# Patient Record
Sex: Male | Born: 1940
Health system: Southern US, Community
[De-identification: ages and names within clinical notes are randomized; demographics above are authoritative.]

## PROBLEM LIST (undated history)

## (undated) DIAGNOSIS — K219 Gastro-esophageal reflux disease without esophagitis: Secondary | ICD-10-CM

## (undated) DIAGNOSIS — I499 Cardiac arrhythmia, unspecified: Secondary | ICD-10-CM

## (undated) DIAGNOSIS — Z86718 Personal history of other venous thrombosis and embolism: Secondary | ICD-10-CM

## (undated) DIAGNOSIS — F1010 Alcohol abuse, uncomplicated: Secondary | ICD-10-CM

## (undated) DIAGNOSIS — J449 Chronic obstructive pulmonary disease, unspecified: Secondary | ICD-10-CM

## (undated) DIAGNOSIS — Z8619 Personal history of other infectious and parasitic diseases: Secondary | ICD-10-CM

## (undated) DIAGNOSIS — I429 Cardiomyopathy, unspecified: Secondary | ICD-10-CM

## (undated) DIAGNOSIS — D649 Anemia, unspecified: Secondary | ICD-10-CM

## (undated) DIAGNOSIS — I509 Heart failure, unspecified: Secondary | ICD-10-CM

## (undated) HISTORY — PX: CARDIAC CATHETERIZATION: SHX172

## (undated) HISTORY — PX: COLONOSCOPY W/ POLYPECTOMY: SHX1380

---

## 2003-03-27 ENCOUNTER — Other Ambulatory Visit: Payer: Self-pay

## 2007-02-10 ENCOUNTER — Ambulatory Visit: Payer: Self-pay | Admitting: Unknown Physician Specialty

## 2007-06-11 ENCOUNTER — Ambulatory Visit: Payer: Self-pay | Admitting: Unknown Physician Specialty

## 2012-01-26 ENCOUNTER — Inpatient Hospital Stay: Payer: Self-pay | Admitting: Internal Medicine

## 2012-01-26 LAB — CBC
HCT: 42.5 %
HGB: 14.9 g/dL
MCH: 33.5 pg
MCHC: 35.1 g/dL
MCV: 96 fL
Platelet: 210 x10 3/mm 3
RBC: 4.45 x10 6/mm 3
RDW: 13.8 %
WBC: 8.5 x10 3/mm 3

## 2012-01-26 LAB — PROTIME-INR
INR: 1.3
Prothrombin Time: 16.6 s — ABNORMAL HIGH

## 2012-01-26 LAB — COMPREHENSIVE METABOLIC PANEL
Albumin: 3.7 g/dL (ref 3.4–5.0)
BUN: 12 mg/dL (ref 7–18)
Calcium, Total: 8.7 mg/dL (ref 8.5–10.1)
Chloride: 107 mmol/L (ref 98–107)
Co2: 27 mmol/L (ref 21–32)
EGFR (African American): >60
Osmolality: 282 (ref 275–301)
Potassium: 4.1 mmol/L (ref 3.5–5.1)
SGOT(AST): 22 U/L (ref 15–37)
Sodium: 141 mmol/L (ref 136–145)
Total Protein: 7.6 g/dL (ref 6.4–8.2)

## 2012-01-26 LAB — TROPONIN I: Troponin-I: <0.02 ng/mL

## 2012-01-26 LAB — CK TOTAL AND CKMB (NOT AT ARMC)
CK, Total: 45 U/L
CK-MB: 0.5 ng/mL

## 2012-01-27 LAB — BASIC METABOLIC PANEL
Anion Gap: 9 (ref 7–16)
BUN: 12 mg/dL (ref 7–18)
Chloride: 109 mmol/L — ABNORMAL HIGH (ref 98–107)
EGFR (African American): >60
Glucose: 105 mg/dL — ABNORMAL HIGH (ref 65–99)
Potassium: 4.5 mmol/L (ref 3.5–5.1)

## 2012-01-27 LAB — CBC WITH DIFFERENTIAL/PLATELET
Basophil #: 0 10*3/uL (ref 0.0–0.1)
Basophil %: 0.4 %
Eosinophil #: 0 10*3/uL (ref 0.0–0.7)
HGB: 12.7 g/dL — ABNORMAL LOW (ref 13.0–18.0)
Lymphocyte %: 28.2 %
MCH: 33.9 pg (ref 26.0–34.0)
MCHC: 35.2 g/dL (ref 32.0–36.0)
Monocyte %: 6.7 %
Neutrophil #: 4.7 10*3/uL (ref 1.4–6.5)
Neutrophil %: 64.1 %
RDW: 13.8 % (ref 11.5–14.5)
WBC: 7.3 10*3/uL (ref 3.8–10.6)

## 2012-01-27 LAB — APTT: Activated PTT: 116.9 secs — ABNORMAL HIGH (ref 23.6–35.9)

## 2012-01-27 LAB — LIPID PANEL
Cholesterol: 126 mg/dL (ref 0–200)
Triglycerides: 64 mg/dL (ref 0–200)

## 2012-01-27 LAB — HEMOGLOBIN A1C: Hemoglobin A1C: 5.3 % (ref 4.2–6.3)

## 2012-01-29 LAB — APTT: Activated PTT: 41 secs — ABNORMAL HIGH (ref 23.6–35.9)

## 2012-01-29 LAB — HEMOGLOBIN: HGB: 12.3 g/dL — ABNORMAL LOW (ref 13.0–18.0)

## 2012-01-30 LAB — PROTIME-INR
INR: 1.4
Prothrombin Time: 17.2 secs — ABNORMAL HIGH (ref 11.5–14.7)

## 2012-01-31 LAB — PROTIME-INR: INR: 1.6

## 2012-02-01 LAB — PROTIME-INR
INR: 1.9
Prothrombin Time: 22 secs — ABNORMAL HIGH (ref 11.5–14.7)

## 2012-02-01 LAB — HEMOGLOBIN: HGB: 11 g/dL — ABNORMAL LOW (ref 13.0–18.0)

## 2014-07-18 NOTE — Op Note (Signed)
PATIENT NAME:  Hunter Adkins, Hunter Adkins MR#:  [id number] DATE OF BIRTH:  [birth year]  DATE OF PROCEDURE:  01/28/2012  PREOPERATIVE DIAGNOSES:  1. Peripheral arterial disease with rest pain of left lower extremity with acute on chronic presentation.  2. Heavy tobacco dependence.  3. Hypertension.   POSTOPERATIVE DIAGNOSES:  1. Peripheral arterial disease with rest pain of left lower extremity with acute on chronic presentation.  2. Heavy tobacco dependence.  3. Hypertension.   PROCEDURES:  1. Catheter placement into left peroneal artery from right femoral approach.  2. Aortogram and selective left lower extremity angiogram.  3. Catheter-directed thrombolysis with 4 mg of TPA to the left lower extremity with the AngioJet catheter.  4. Mechanical rheolytic thrombectomy of left iliac artery, common femoral artery, superficial femoral artery, popliteal artery, tibioperoneal trunk, and peroneal artery with the AngioJet catheters.  5. Percutaneous transluminal angioplasty of peroneal artery and tibioperoneal trunk with 3 mm diameter angioplasty balloon.  6. Percutaneous transluminal angioplasty of left tibioperoneal trunk and popliteal artery with 4 mm diameter angioplasty balloon.  7. StarClose closure device right femoral artery.   SURGEON: Annice Needy, M.D.   ANESTHESIA: Local with moderate conscious sedation.   ESTIMATED BLOOD LOSS: Approximately 25 mL.  FLUOROSCOPY TIME: 8.7 minutes.   CONTRAST USED:  90 mL Visipaque.   INDICATION FOR PROCEDURE: This is a gentleman who presented to the hospital with acute rest pain of the left lower extremity. He has had short distance claudication previously and he had had previous surgery intervention to his right lower extremity at an outside institution. His pain improved with anticoagulation but did not resolve and a CT scan showed left iliac occlusion that appeared to be acute on chronic. The patient is brought down for angiography for further evaluation  and possible treatment. Risks and benefits were discussed and informed consent was obtained.   DESCRIPTION OF PROCEDURE: The patient was brought to the vascular interventional radiology suite, groins were shaved and prepped and a sterile surgical field was created. The right femoral head was localized with fluoroscopy and the right femoral artery was accessed without difficulty with a Seldinger needle. A J-wire and 5 French sheath were placed. Pigtail catheter was placed in the aorta and AP aortogram was performed. This was followed by pelvic obliques. This demonstrated occlusion of the left iliac artery at its bifurcation with what appeared to be thrombus within chronic disease. I then heparinized the patient and placed a 6 French Ansel sheath over a Terumo Advantage wire. I laced this lesion with 4 mg of TPA delivered through the AngioJet catheter. Imaging after this showed that the area in the iliac had resolved but now we were occluded in the common femoral and further down. Mechanical rheolytic thrombectomy was performed from the iliac artery down to the common femoral artery and the superficial femoral and popliteal arteries. There was moderate disease within the mid superficial femoral artery over a reasonable segment that was in the 50% range. The iliac lesion was also in the mild to moderate range. We continued mechanical rheolytic thrombectomy down in the superficial femoral artery and popliteal artery. Imaging showed that now there was occlusion at the distal popliteal artery and tibioperoneal trunk with the peroneal artery being the best runoff which reconstituted several centimeters more distally. Mechanical rheolytic thrombectomy was performed with the smaller AngioJet catheter down into the popliteal artery and tibioperoneal trunk down to the peroneal artery. For the residual lesion at this location, a 3 mm diameter angioplasty balloon was  inflated in the proximal peroneal artery and tibioperoneal  trunk and the 4 mm diameter angioplasty balloon was used in the proximal tibia peroneal artery and distal popliteal artery. Completion angiogram following this showed markedly improved flow with the peroneal artery being patent down to its distal termination with collaterals into the foot. I elected not to treat the mild to moderate disease in the iliac and SFA at this time. The patient will be maintained on a heparin drip as well as an Integrilin drip for residual thrombus that was present most distally. The sheath was pulled back to the ipsilateral external artery and oblique     arteriogram was performed. A StarClose closure device was deployed in the usual fashion with excellent hemostatic result. The patient tolerated the procedure well and was taken to the recovery room in stable condition.  ____________________________ Annice NeedyJason S. Dew, MD jsd:slb D: 01/28/2012 16:05:15 ET T: 01/28/2012 16:34:13 ET JOB#: [id number]  cc: Annice NeedyJason S. Dew, MD, <Dictator> Annice NeedyJASON S DEW MD ELECTRONICALLY SIGNED 02/02/2012 13:14

## 2014-07-18 NOTE — H&P (Signed)
PATIENT NAME:  Hunter Adkins, Hunter Adkins MR#:  [id number] DATE OF BIRTH:  1940-04-25  DATE OF ADMISSION:  01/26/2012  PRIMARY CARE PROVIDER: Dr. Burnett Sheng   ED REFERRING PHYSICIAN: Dr. Margarita Grizzle    REASON FOR ADMISSION: Left leg pain.   HISTORY OF PRESENT ILLNESS: The patient is a 74 year old white male with previous history of DVT, coronary artery disease, hypertension, hyperlipidemia, history of CVA/stroke in the past who is on chronic Coumadin therapy who reports that he was visiting the beach recently and was noncompliant with his Coumadin, has not probably taken five doses of his Coumadin according to his wife. They were driving back when all of a sudden he started having pain in the left coronary area and then the pain went down to his leg. The left leg started becoming very numb and started feeling very cold. He states that he was unable to move the leg. However, he is able to bend his leg currently. The patient reports that similar type of thing happened three years ago to his right leg and had to have surgery to remove a clot. Otherwise, when he arrived he was also a little nauseous. He denies any fevers or chills. No chest pains. No shortness of breath. No palpitations. No syncope. No abdominal pain, vomiting, or diarrhea. Does have nausea. Denies any urinary symptoms.   PAST MEDICAL HISTORY:  1. There is a questionable history of DVT. He is not 100% sure. 2. History of MI. He reports that he had three MI's. He had lesions that were not stentable.  3. Hypertension.  4. Hyperlipidemia.  5. History of CVA. He reports that he had two "mini" strokes. Has not left him with any residual deficits.   PAST SURGICAL HISTORY: Status post likely what sounds like thrombectomy to the right leg.  ALLERGIES: Allergies to no medications.    CURRENT MEDICATIONS: He is supposed to be on: 1. Aspirin 81 one tab p.o. daily.  2. Carvedilol 6.25 one tab p.o. b.i.d.  3. Folic acid 1 tab p.o. daily.  4. Lasix 20 daily.   5. Lisinopril 20 daily.  6. Multivitamin daily.  7. Simvastatin 40 mg at bedtime.  8. Vitamin B12 500 mcg daily.  9. Vitamin D3 2000 IU daily.  10. Coumadin 5 mg daily.   SOCIAL HISTORY: Continues to smoke greater than 1 pack per day. Drinks 1 to 2 beers per day. Denies any drug use.   FAMILY HISTORY: There is history of coronary artery disease in his father and brother.   REVIEW OF SYSTEMS: CONSTITUTIONAL: Denies any fevers. Complains of fatigue, weakness, left leg pain. No weight loss. No weight gain. EYES: No blurred or double vision. No pain. No redness. No inflammation. ENT: No tinnitus. No ear pain. No hearing loss. No seasonal or year round allergies. No difficulty swallowing. RESPIRATORY: Denies any cough, wheezing. No diagnosis of COPD. CARDIOVASCULAR: Denies any chest pain or orthopnea. No edema. No arrhythmia. No syncope. GI: Complains of some nausea but no vomiting or diarrhea. No abdominal pain. No hematemesis. No melena. No gastroesophageal reflux disease. No IBS. No jaundice. GU: Denies any dysuria, hematuria, renal calculus, or frequency. ENDOCRINE: Denies any polyuria, nocturia, or thyroid problems. HEME/LYMPH: Denies any major bruisability or bleeding. SKIN: No acne. No rash. No changes in mole, hair or skin. MUSCULOSKELETAL: Denies any pain in the neck, back, or shoulder. No gout. NEUROLOGIC: Complains of numbness in the left leg. Has history of CVA. No history of seizures. PSYCHIATRIC: No anxiety. No insomnia. No ADD.  PHYSICAL EXAMINATION:   VITAL SIGNS: Temperature 96, pulse 75, respirations 20, blood pressure 120/73, O2 98%.   GENERAL: The patient is a thin appearing Caucasian male a little uncomfortable but not in any acute distress.   HEENT: Head atraumatic, normocephalic. Pupils equally round, reactive to light and accommodation. There is no conjunctival pallor. No scleral icterus. Nasal exam shows no drainage or ulceration. Oropharynx is clear without any exudates.    NECK: No thyromegaly. No carotid bruits.   CARDIOVASCULAR: Irregularly irregular rhythm. No murmurs, rubs, clicks, or gallops. PMI is not displaced.   LUNGS: Clear to auscultation bilaterally without any rales, rhonchi, or wheezing.   ABDOMEN: Soft, nontender, nondistended. Positive bowel sounds x4. There is no hepatosplenomegaly.   SKIN: There is no rash.   EXTREMITIES: Left leg is cool to touch, appears pale. DP, PT pulses are significantly diminished. There is no purplish discoloration of the foot noted. Right leg diminished pulses DP and PT. There is obvious temperature difference between the two feet.   NEUROLOGICAL: Cranial nerves II through XII grossly intact. No focal deficits.   PSYCHIATRIC: Not anxious or depressed.   EVALUATIONS: Glucose 113, BUN 12, creatinine 1.27, sodium 141, potassium 4.1, chloride 107, CO2 27, calcium 8.7. LFTs were normal. Troponin less than 0.02. WBC 8.5, hemoglobin 14.9, platelet count 211. INR 1.3.   EKG showed atrial fibrillation with heart rate in the 80's.   ASSESSMENT AND PLAN: The patient is a 74 year old white male with previous history of having occlusion involving his right leg, also has a history of coronary artery disease, hypertension, and hyperlipidemia who presents to the ED with acute onset of left leg pain. The ED MD has spoken to the vascular physician who recommends Medicine admit.  1. Left leg pain due to likely acute arterial occlusion involving the left arterial circulation. At this time I will continue IV heparin as has been written by the ED physician. Dr. Margarita GrizzleWoodruff, the ED MD, has spoken to Dr. Wyn Quakerew who recommends the patient be admitted. I will keep him n.p.o. The patient will likely need arteriogram and further treatment as recommended by Dr. Wyn Quakerew.  2. Atrial fibrillation. Will continue Coreg. Place him on telemetry. He is supposed to be on Coumadin but has been noncompliant with his Coumadin. At this time will hold his Coumadin.  Continue heparin. This will need to be restarted when able to.  3. Hypertension. Will continue lisinopril.  4. Hyperlipidemia. Continue simvastatin.  5. Coronary artery disease. He is on Coreg and aspirin which I will continue.  6. Miscellaneous. The patient will be on a heparin drip which should be sufficient for DVT prophylaxis.    TIME SPENT: 40 minutes.   ____________________________ Lacie ScottsShreyang H. Allena KatzPatel, MD shp:drc D: 01/26/2012 21:06:03 ET T: 01/27/2012 06:16:09 ET JOB#: [id number]  cc: Mortimer Bair H. Allena KatzPatel, MD, <Dictator> Rhona LeavensJames F. Burnett ShengHedrick, MD Charise CarwinSHREYANG H Amiley Shishido MD ELECTRONICALLY SIGNED 01/30/2012 17:36

## 2014-07-18 NOTE — Discharge Summary (Signed)
PATIENT NAME:  Hunter Adkins, Hunter Adkins MR#:  [id number] DATE OF BIRTH:  1940/05/08  DATE OF ADMISSION:  01/26/2012 DATE OF DISCHARGE:  02/01/2012  ADMITTING DIAGNOSIS:  Left lower extremity pain likely due to acute arterial occlusion.  DISCHARGE DIAGNOSES:  1. Chronic left lower extremity thromboembolism, status post thrombolysis, mechanical thrombectomy of left iliac artery, superficial femoral artery, also common femoral artery, popliteal artery, tibioperoneal trunk, perineal artery with AngioJet catheter, percutaneous transluminal angioplasty of peroneal artery and tibial peroneal trunk with 3 mm diameter angioplasty balloon, percutaneous transluminal angioplasty of left tibioperoneal trunk and popliteal artery with 4 mm diameter angioplasty balloon. StarClose closure device right femoral artery. This surgical procedure was performed on 01/28/2012 by Dr Wyn Quakerew.   2. Tobacco dependence. 3. Hypertension.      4. History and known severe lower extremity peripheral artery disease.  5. Ongoing tobacco abuse as mentioned above.  6. Chronic obstructive pulmonary disease, acute bronchitis. 7. Hyperlipidemia. 8. Coronary artery disease, status post three myocardial infarctions in the past. 9. History of cerebrovascular accident in the past.   DISCHARGE CONDITION: Stable.   DISCHARGE MEDICATIONS:  1. The patient is to resume her multivitamins once daily.  2. Vitamin D3 at 2000 units once daily.  3. Folic acid 0.8 mg p.o. daily.  4. Aspirin 81 mg p.o. daily.  5. Furosemide 10 mg p.o. daily.  6. Carvedilol 6.25 mg p.o. twice daily. 7. Warfarin 5 mg p.o. daily. 8. Lovastatin 40 mg p.o. at bedtime.  9. Vitamin B12 at 500 mcg p.o. daily.  10. Lisinopril 10 mg p.o. twice daily. This is a new dose.   New medications: 1. Tiotropium 18 mcg inhalation daily. 2. Advair Diskus 250/50 one puff twice daily. 3. Azithromycin 250 mg p.o. once daily for 4 more days. 4. Nicotine oral inhaler 1 inhalation every 1 hour  as needed. 5. Albuterol ipratropium nebulizers 25/0.5 mg in 3 mL inhalation solution 4 times daily as needed. Feosol 325 mg 3 times daily.  6. Home oxygen: None.   DIET: 2 grams salt, low fat, low cholesterol, regular consistency.   ACTIVITY LIMITATIONS: As tolerated.   FOLLOWUP: Follow-up appointment with Dr. Burnett ShengHedrick in 2 days after discharge. Also Dr. Wyn Quakerew in one week after discharge.    CONSULTANTS:  Dr. Wyn Quakerew. Care Management   RADIOLOGIC STUDIES: CT angiography of ileofemoral runoff January 26, 2012 revealed a heart enlarged, mild bibasilar atelectasis, occlusion of left common iliac artery just distalk to the bifurcation of the distal left external iliac artery. The left popliteal artery is diminutive with little to no flow in the left trifurcation vessels, focal high-grade stenosis in the proximal right superficial femoral artery. Chest, portable single view, January 26, 2012, mild interstitial infiltrate, atelectasis versus infiltrate in the region of the lingula. Ultrasound of left upper extremity on November 3,2013 due to swelling revealed no evidence of left upper extremity deep vein thrombosis from the antecubital regions to the internal jugular and subclavian region.   HISTORY OF PRESENT ILLNESS: The patient is a 74 year old Caucasian male with history of heavy tobacco abuse, history of hypertension as well as hyperlipidemia, peripheral vascular disease, history of coronary artery disease as well as deep vein thrombosis in the past who presented to the hospital with complaints of left leg pain. Please refer to Dr. Serita GritShreyang Patel's admission note on January 26, 2012. On arrival to emergency room, his vitals were unremarkable with temperature of 96, pulse 75, respiration rate 20, blood pressure 120/73, oxygen saturation is 98%. Physical exam revealed  left leg which was cool to touch and was pale.  Patient's dorsalis pedis as well as posterior tibialis pulses were significantly diminished. No  purplish discoloration of foot was noted. Right leg had diminished pulses as well but there was a temperature difference between 2 feet.   LABORATORY DATA: Lab data done on January 26, 2012, revealed normal BMP with elevated glucose to 113. The patient's estimated GFR for non-African American would be 57. Liver enzymes were normal. Cardiac enzymes 1 set was within normal limits. CBC was within normal limits The patient's coagulation panel revealed prothrombin time slightly elevated at 16.6, INR was 1.3, activated PTT was 29.1. The patient's EKG showed atrial fibrillation at rate of 80, left axis deviation, nonspecific ST-T abnormality, possible digitalis effect. Patient was admitted to the hospital. He was restarted on anticoagulation with heparin due to acute on chronic left lower extremity thromboembolism. Consultation with Dr. Wyn Quaker was obtained and patient proceeded to vascular surgery on 01/28/2012. Dr. Wyn Quaker did catheter placement in the left peroneal artery from the right femoral approach. He did aortogram as well as selective left lower extremity angiogram. Catheter-directed thrombolysis with 4 mg of TPA to the left lower extremity with AngioJet catheter was performed as well as mechanical thrombectomy of left iliac artery, common femoral artery, superficial femoral artery, popliteal artery, tibioperoneal trunk as well as peroneal artery with AngioJet catheters. Percutaneous transluminal angioplasty of the peroneal artery and tibial peroneal trunk with 3 mm diameter angioplasty balloon as well as percutaneous transluminal angioplasty of left tibioperoneal trunk and popliteal artery with 4 mm diameter angioplasty balloon were performed. StarClose in right femoral artery was placed.   Post procedure, patient did well. However, on 01/31/2012 he developed nasal bleed which was felt to be due to oxygen given for him through nasal cannula as well as anticoagulation with Lovenox subcutaneously. Lovenox was briefly  stopped. The patient however, was continued on Coumadin therapy and he was loaded with Coumadin. On the day of discharge, February 01, 2012, the patient's INR 1.9. He was given 5 more milligrams of Coumadin prior to discharge. He was also given Lovenox. He is to take next dose of 5 mg of Coumadin tomorrow in the morning at around 11:00 a.m. on 12/03/2011. He is to follow up with Dr. Burnett Sheng in the next few days after discharge to check his INR to make sure that this is therapeutic. He is also to follow up with Dr. Wyn Quaker for further recommendations. The patient is to continue aspirin therapy as well.   In regards to ongoing tobacco abuse, the patient was counseled extensively and he was agreeable to try nicotine oral inhaler which helped him somewhat. He is to continue inhalation therapy for suspected pneumonia versus bronchitis with Advair Diskus as well as ipratropium and nebulizers. He was checked for oxygen need. He ambulated in the hospital prior to discharge and his oxygen saturation remained stable at around 91% to 93% on room air. It was felt that the patient does not need oxygen at home; however, he is to follow up with Dr. Burnett Sheng and he may benefit from nocturnal oximetry study done to ensure that his oxygen nocturnal oxygenation is also satisfactory.   The patient is to continue antibiotic therapy for 4 more days, azithromycin. Unfortunately, we were not able to get any sputum cultures.  In regards to hypertension, patient's blood pressure was elevated and we made decision to increase his dose of lisinopril. On day of discharge, patient's blood pressure is better controlled.  Patient is to follow up with his primary care physician for further recommendations in regards to his blood pressure management. His vital signs on day of discharge: Temperature 97.9, pulse was 96, respiratory rate was 18, blood pressure 140, ranging from 120s to 140s and 80s to 90s diastolic, oxygen saturation as mentioned above  was 91% to 93% on room air at rest as well as on exertion.  Regarding his chronic medical problems such as hyperlipidemia as well as history of coronary artery disease and CVA as mentioned above, the patient is to continue aspirin as well as cholesterol lowering medications. No changes were made in this area. It is recommended to follow patient's lipid pattern. Patient's LDL was checked while he was admitted to the hospital;  his LDL was found to be at 70. The patient's total cholesterol was found to be 126, triglycerides were 64, and HDL was 43. The patient's hemoglobin A1c was normal at 5.3.   The patient is being discharged in stable condition with the above-mentioned medications and followup. Of note, after nasal bleed, the patient's hemoglobin dropped down to 11.0 from 14.9 on day of admission. The patient was advised to continue iron supplementation. The patient is being discharged in stable condition with the above-mentioned medications and followup.   TIME SPENT: 40 minutes.     ____________________________ Katharina Caper, MD rv:vtd Adkins: 02/01/2012 18:45:42 ET T: 02/02/2012 11:03:49 ET JOB#: [id number]  cc: Katharina Caper, MD, <Dictator> Rhona Leavens. Burnett Sheng, MD Katharina Caper MD ELECTRONICALLY SIGNED 02/13/2012 12:46

## 2015-04-03 DIAGNOSIS — I482 Chronic atrial fibrillation: Secondary | ICD-10-CM | POA: Diagnosis not present

## 2015-05-01 DIAGNOSIS — I482 Chronic atrial fibrillation: Secondary | ICD-10-CM | POA: Diagnosis not present

## 2015-05-22 DIAGNOSIS — I482 Chronic atrial fibrillation: Secondary | ICD-10-CM | POA: Diagnosis not present

## 2015-06-12 DIAGNOSIS — E785 Hyperlipidemia, unspecified: Secondary | ICD-10-CM | POA: Diagnosis not present

## 2015-06-12 DIAGNOSIS — Z125 Encounter for screening for malignant neoplasm of prostate: Secondary | ICD-10-CM | POA: Diagnosis not present

## 2015-06-12 DIAGNOSIS — I10 Essential (primary) hypertension: Secondary | ICD-10-CM | POA: Diagnosis not present

## 2015-06-19 DIAGNOSIS — E785 Hyperlipidemia, unspecified: Secondary | ICD-10-CM | POA: Diagnosis not present

## 2015-06-19 DIAGNOSIS — J40 Bronchitis, not specified as acute or chronic: Secondary | ICD-10-CM | POA: Diagnosis not present

## 2015-06-19 DIAGNOSIS — J449 Chronic obstructive pulmonary disease, unspecified: Secondary | ICD-10-CM | POA: Diagnosis not present

## 2015-08-03 DIAGNOSIS — I482 Chronic atrial fibrillation: Secondary | ICD-10-CM | POA: Diagnosis not present

## 2015-08-13 DIAGNOSIS — I420 Dilated cardiomyopathy: Secondary | ICD-10-CM | POA: Diagnosis not present

## 2015-08-13 DIAGNOSIS — I5022 Chronic systolic (congestive) heart failure: Secondary | ICD-10-CM | POA: Diagnosis not present

## 2015-08-13 DIAGNOSIS — I482 Chronic atrial fibrillation: Secondary | ICD-10-CM | POA: Diagnosis not present

## 2015-08-13 DIAGNOSIS — Z9889 Other specified postprocedural states: Secondary | ICD-10-CM | POA: Diagnosis not present

## 2015-08-13 DIAGNOSIS — J410 Simple chronic bronchitis: Secondary | ICD-10-CM | POA: Diagnosis not present

## 2015-08-13 DIAGNOSIS — F1010 Alcohol abuse, uncomplicated: Secondary | ICD-10-CM | POA: Diagnosis not present

## 2015-08-31 DIAGNOSIS — I482 Chronic atrial fibrillation: Secondary | ICD-10-CM | POA: Diagnosis not present

## 2015-10-10 DIAGNOSIS — I482 Chronic atrial fibrillation: Secondary | ICD-10-CM | POA: Diagnosis not present

## 2015-11-07 DIAGNOSIS — I480 Paroxysmal atrial fibrillation: Secondary | ICD-10-CM | POA: Diagnosis not present

## 2015-12-10 DIAGNOSIS — I482 Chronic atrial fibrillation: Secondary | ICD-10-CM | POA: Diagnosis not present

## 2015-12-31 DIAGNOSIS — E785 Hyperlipidemia, unspecified: Secondary | ICD-10-CM | POA: Diagnosis not present

## 2015-12-31 DIAGNOSIS — Z125 Encounter for screening for malignant neoplasm of prostate: Secondary | ICD-10-CM | POA: Diagnosis not present

## 2015-12-31 DIAGNOSIS — I480 Paroxysmal atrial fibrillation: Secondary | ICD-10-CM | POA: Diagnosis not present

## 2015-12-31 DIAGNOSIS — I10 Essential (primary) hypertension: Secondary | ICD-10-CM | POA: Diagnosis not present

## 2015-12-31 DIAGNOSIS — J440 Chronic obstructive pulmonary disease with acute lower respiratory infection: Secondary | ICD-10-CM | POA: Diagnosis not present

## 2015-12-31 DIAGNOSIS — J209 Acute bronchitis, unspecified: Secondary | ICD-10-CM | POA: Diagnosis not present

## 2015-12-31 DIAGNOSIS — Z23 Encounter for immunization: Secondary | ICD-10-CM | POA: Diagnosis not present

## 2015-12-31 DIAGNOSIS — Z0000 Encounter for general adult medical examination without abnormal findings: Secondary | ICD-10-CM | POA: Diagnosis not present

## 2016-01-28 DIAGNOSIS — I482 Chronic atrial fibrillation: Secondary | ICD-10-CM | POA: Diagnosis not present

## 2016-02-11 DIAGNOSIS — I482 Chronic atrial fibrillation: Secondary | ICD-10-CM | POA: Diagnosis not present

## 2016-02-11 DIAGNOSIS — I420 Dilated cardiomyopathy: Secondary | ICD-10-CM | POA: Diagnosis not present

## 2016-02-11 DIAGNOSIS — I5022 Chronic systolic (congestive) heart failure: Secondary | ICD-10-CM | POA: Diagnosis not present

## 2016-02-11 DIAGNOSIS — J410 Simple chronic bronchitis: Secondary | ICD-10-CM | POA: Diagnosis not present

## 2016-02-11 DIAGNOSIS — Z9889 Other specified postprocedural states: Secondary | ICD-10-CM | POA: Diagnosis not present

## 2016-02-11 DIAGNOSIS — F1010 Alcohol abuse, uncomplicated: Secondary | ICD-10-CM | POA: Diagnosis not present

## 2016-03-10 DIAGNOSIS — I480 Paroxysmal atrial fibrillation: Secondary | ICD-10-CM | POA: Diagnosis not present

## 2016-04-07 DIAGNOSIS — I480 Paroxysmal atrial fibrillation: Secondary | ICD-10-CM | POA: Diagnosis not present

## 2016-04-14 DIAGNOSIS — I480 Paroxysmal atrial fibrillation: Secondary | ICD-10-CM | POA: Diagnosis not present

## 2016-05-09 DIAGNOSIS — I480 Paroxysmal atrial fibrillation: Secondary | ICD-10-CM | POA: Diagnosis not present

## 2016-06-09 DIAGNOSIS — I480 Paroxysmal atrial fibrillation: Secondary | ICD-10-CM | POA: Diagnosis not present

## 2016-07-07 DIAGNOSIS — I480 Paroxysmal atrial fibrillation: Secondary | ICD-10-CM | POA: Diagnosis not present

## 2016-07-15 DIAGNOSIS — L237 Allergic contact dermatitis due to plants, except food: Secondary | ICD-10-CM | POA: Diagnosis not present

## 2016-07-30 DIAGNOSIS — J410 Simple chronic bronchitis: Secondary | ICD-10-CM | POA: Diagnosis not present

## 2016-07-30 DIAGNOSIS — I480 Paroxysmal atrial fibrillation: Secondary | ICD-10-CM | POA: Diagnosis not present

## 2016-07-30 DIAGNOSIS — I482 Chronic atrial fibrillation: Secondary | ICD-10-CM | POA: Diagnosis not present

## 2016-07-30 DIAGNOSIS — I420 Dilated cardiomyopathy: Secondary | ICD-10-CM | POA: Diagnosis not present

## 2016-07-30 DIAGNOSIS — I5022 Chronic systolic (congestive) heart failure: Secondary | ICD-10-CM | POA: Diagnosis not present

## 2016-07-30 DIAGNOSIS — Z9889 Other specified postprocedural states: Secondary | ICD-10-CM | POA: Diagnosis not present

## 2016-09-01 DIAGNOSIS — I480 Paroxysmal atrial fibrillation: Secondary | ICD-10-CM | POA: Diagnosis not present

## 2016-09-01 DIAGNOSIS — I420 Dilated cardiomyopathy: Secondary | ICD-10-CM | POA: Diagnosis not present

## 2016-09-01 DIAGNOSIS — I5022 Chronic systolic (congestive) heart failure: Secondary | ICD-10-CM | POA: Diagnosis not present

## 2016-09-01 DIAGNOSIS — I482 Chronic atrial fibrillation: Secondary | ICD-10-CM | POA: Diagnosis not present

## 2016-10-10 DIAGNOSIS — I480 Paroxysmal atrial fibrillation: Secondary | ICD-10-CM | POA: Diagnosis not present

## 2016-10-14 DIAGNOSIS — I420 Dilated cardiomyopathy: Secondary | ICD-10-CM | POA: Diagnosis not present

## 2016-10-14 DIAGNOSIS — I482 Chronic atrial fibrillation: Secondary | ICD-10-CM | POA: Diagnosis not present

## 2016-10-14 DIAGNOSIS — Z9889 Other specified postprocedural states: Secondary | ICD-10-CM | POA: Diagnosis not present

## 2016-10-14 DIAGNOSIS — J410 Simple chronic bronchitis: Secondary | ICD-10-CM | POA: Diagnosis not present

## 2016-10-14 DIAGNOSIS — F1010 Alcohol abuse, uncomplicated: Secondary | ICD-10-CM | POA: Diagnosis not present

## 2016-10-14 DIAGNOSIS — I5022 Chronic systolic (congestive) heart failure: Secondary | ICD-10-CM | POA: Diagnosis not present

## 2016-11-06 DIAGNOSIS — I480 Paroxysmal atrial fibrillation: Secondary | ICD-10-CM | POA: Diagnosis not present

## 2016-12-08 DIAGNOSIS — I480 Paroxysmal atrial fibrillation: Secondary | ICD-10-CM | POA: Diagnosis not present

## 2017-01-05 DIAGNOSIS — I482 Chronic atrial fibrillation: Secondary | ICD-10-CM | POA: Diagnosis not present

## 2017-02-02 DIAGNOSIS — I482 Chronic atrial fibrillation: Secondary | ICD-10-CM | POA: Diagnosis not present

## 2017-03-02 DIAGNOSIS — I482 Chronic atrial fibrillation: Secondary | ICD-10-CM | POA: Diagnosis not present

## 2017-04-06 DIAGNOSIS — I482 Chronic atrial fibrillation: Secondary | ICD-10-CM | POA: Diagnosis not present

## 2017-04-27 DIAGNOSIS — Z9889 Other specified postprocedural states: Secondary | ICD-10-CM | POA: Diagnosis not present

## 2017-04-27 DIAGNOSIS — I420 Dilated cardiomyopathy: Secondary | ICD-10-CM | POA: Diagnosis not present

## 2017-04-27 DIAGNOSIS — F1010 Alcohol abuse, uncomplicated: Secondary | ICD-10-CM | POA: Diagnosis not present

## 2017-04-27 DIAGNOSIS — I482 Chronic atrial fibrillation: Secondary | ICD-10-CM | POA: Diagnosis not present

## 2017-04-27 DIAGNOSIS — I5022 Chronic systolic (congestive) heart failure: Secondary | ICD-10-CM | POA: Diagnosis not present

## 2017-04-27 DIAGNOSIS — J410 Simple chronic bronchitis: Secondary | ICD-10-CM | POA: Diagnosis not present

## 2017-05-11 DIAGNOSIS — E785 Hyperlipidemia, unspecified: Secondary | ICD-10-CM | POA: Diagnosis not present

## 2017-05-11 DIAGNOSIS — R5381 Other malaise: Secondary | ICD-10-CM | POA: Diagnosis not present

## 2017-05-11 DIAGNOSIS — E538 Deficiency of other specified B group vitamins: Secondary | ICD-10-CM | POA: Diagnosis not present

## 2017-05-11 DIAGNOSIS — M545 Low back pain: Secondary | ICD-10-CM | POA: Diagnosis not present

## 2017-05-11 DIAGNOSIS — J449 Chronic obstructive pulmonary disease, unspecified: Secondary | ICD-10-CM | POA: Diagnosis not present

## 2017-05-11 DIAGNOSIS — I482 Chronic atrial fibrillation: Secondary | ICD-10-CM | POA: Diagnosis not present

## 2017-05-11 DIAGNOSIS — Z125 Encounter for screening for malignant neoplasm of prostate: Secondary | ICD-10-CM | POA: Diagnosis not present

## 2017-05-11 DIAGNOSIS — R5383 Other fatigue: Secondary | ICD-10-CM | POA: Diagnosis not present

## 2017-05-11 DIAGNOSIS — Z23 Encounter for immunization: Secondary | ICD-10-CM | POA: Diagnosis not present

## 2017-05-11 DIAGNOSIS — Z0000 Encounter for general adult medical examination without abnormal findings: Secondary | ICD-10-CM | POA: Diagnosis not present

## 2017-05-11 DIAGNOSIS — Z1211 Encounter for screening for malignant neoplasm of colon: Secondary | ICD-10-CM | POA: Diagnosis not present

## 2017-06-08 DIAGNOSIS — I482 Chronic atrial fibrillation: Secondary | ICD-10-CM | POA: Diagnosis not present

## 2017-07-06 DIAGNOSIS — I482 Chronic atrial fibrillation: Secondary | ICD-10-CM | POA: Diagnosis not present

## 2017-07-23 DIAGNOSIS — Z8619 Personal history of other infectious and parasitic diseases: Secondary | ICD-10-CM | POA: Diagnosis not present

## 2017-07-23 DIAGNOSIS — I5022 Chronic systolic (congestive) heart failure: Secondary | ICD-10-CM | POA: Diagnosis not present

## 2017-07-23 DIAGNOSIS — D649 Anemia, unspecified: Secondary | ICD-10-CM | POA: Diagnosis not present

## 2017-07-23 DIAGNOSIS — I420 Dilated cardiomyopathy: Secondary | ICD-10-CM | POA: Diagnosis not present

## 2017-07-23 DIAGNOSIS — Z86010 Personal history of colonic polyps: Secondary | ICD-10-CM | POA: Diagnosis not present

## 2017-07-23 DIAGNOSIS — I482 Chronic atrial fibrillation: Secondary | ICD-10-CM | POA: Diagnosis not present

## 2017-07-23 DIAGNOSIS — K219 Gastro-esophageal reflux disease without esophagitis: Secondary | ICD-10-CM | POA: Diagnosis not present

## 2017-07-23 DIAGNOSIS — Z86718 Personal history of other venous thrombosis and embolism: Secondary | ICD-10-CM | POA: Diagnosis not present

## 2017-08-13 DIAGNOSIS — R7989 Other specified abnormal findings of blood chemistry: Secondary | ICD-10-CM | POA: Diagnosis not present

## 2017-08-13 DIAGNOSIS — I482 Chronic atrial fibrillation: Secondary | ICD-10-CM | POA: Diagnosis not present

## 2017-09-10 DIAGNOSIS — R7989 Other specified abnormal findings of blood chemistry: Secondary | ICD-10-CM | POA: Diagnosis not present

## 2017-09-10 DIAGNOSIS — I482 Chronic atrial fibrillation: Secondary | ICD-10-CM | POA: Diagnosis not present

## 2017-10-12 DIAGNOSIS — I482 Chronic atrial fibrillation: Secondary | ICD-10-CM | POA: Diagnosis not present

## 2017-10-13 ENCOUNTER — Encounter: Payer: Self-pay | Admitting: *Deleted

## 2017-10-14 ENCOUNTER — Ambulatory Visit
Admission: RE | Admit: 2017-10-14 | Discharge: 2017-10-14 | Disposition: A | Discharge status: Home / Self Care | Payer: PPO | Source: Ambulatory Visit | Attending: Unknown Physician Specialty | Admitting: Unknown Physician Specialty

## 2017-10-14 ENCOUNTER — Ambulatory Visit: Payer: PPO | Admitting: Anesthesiology

## 2017-10-14 ENCOUNTER — Encounter
Admission: RE | Disposition: A | Discharge status: Home / Self Care | Payer: Self-pay | Source: Ambulatory Visit | Attending: Unknown Physician Specialty

## 2017-10-14 DIAGNOSIS — Z86010 Personal history of colonic polyps: Secondary | ICD-10-CM | POA: Diagnosis not present

## 2017-10-14 DIAGNOSIS — K3189 Other diseases of stomach and duodenum: Secondary | ICD-10-CM | POA: Diagnosis not present

## 2017-10-14 DIAGNOSIS — D649 Anemia, unspecified: Secondary | ICD-10-CM | POA: Diagnosis not present

## 2017-10-14 DIAGNOSIS — Z86718 Personal history of other venous thrombosis and embolism: Secondary | ICD-10-CM | POA: Diagnosis not present

## 2017-10-14 DIAGNOSIS — K2970 Gastritis, unspecified, without bleeding: Secondary | ICD-10-CM | POA: Diagnosis not present

## 2017-10-14 DIAGNOSIS — K640 First degree hemorrhoids: Secondary | ICD-10-CM | POA: Insufficient documentation

## 2017-10-14 DIAGNOSIS — Z8619 Personal history of other infectious and parasitic diseases: Secondary | ICD-10-CM | POA: Diagnosis not present

## 2017-10-14 DIAGNOSIS — I509 Heart failure, unspecified: Secondary | ICD-10-CM | POA: Insufficient documentation

## 2017-10-14 DIAGNOSIS — K2980 Duodenitis without bleeding: Secondary | ICD-10-CM | POA: Insufficient documentation

## 2017-10-14 DIAGNOSIS — K210 Gastro-esophageal reflux disease with esophagitis: Secondary | ICD-10-CM | POA: Diagnosis not present

## 2017-10-14 DIAGNOSIS — R12 Heartburn: Secondary | ICD-10-CM | POA: Insufficient documentation

## 2017-10-14 DIAGNOSIS — K648 Other hemorrhoids: Secondary | ICD-10-CM | POA: Diagnosis not present

## 2017-10-14 DIAGNOSIS — Z7901 Long term (current) use of anticoagulants: Secondary | ICD-10-CM | POA: Diagnosis not present

## 2017-10-14 DIAGNOSIS — Z1211 Encounter for screening for malignant neoplasm of colon: Secondary | ICD-10-CM | POA: Diagnosis not present

## 2017-10-14 DIAGNOSIS — I4891 Unspecified atrial fibrillation: Secondary | ICD-10-CM | POA: Diagnosis not present

## 2017-10-14 DIAGNOSIS — Z87891 Personal history of nicotine dependence: Secondary | ICD-10-CM | POA: Diagnosis not present

## 2017-10-14 DIAGNOSIS — K209 Esophagitis, unspecified: Secondary | ICD-10-CM | POA: Diagnosis not present

## 2017-10-14 DIAGNOSIS — J449 Chronic obstructive pulmonary disease, unspecified: Secondary | ICD-10-CM | POA: Insufficient documentation

## 2017-10-14 DIAGNOSIS — K219 Gastro-esophageal reflux disease without esophagitis: Secondary | ICD-10-CM | POA: Diagnosis not present

## 2017-10-14 HISTORY — PX: COLONOSCOPY WITH PROPOFOL: SHX5780

## 2017-10-14 HISTORY — DX: Alcohol abuse, uncomplicated: F10.10

## 2017-10-14 HISTORY — DX: Personal history of other infectious and parasitic diseases: Z86.19

## 2017-10-14 HISTORY — DX: Gastro-esophageal reflux disease without esophagitis: K21.9

## 2017-10-14 HISTORY — DX: Cardiac arrhythmia, unspecified: I49.9

## 2017-10-14 HISTORY — DX: Chronic obstructive pulmonary disease, unspecified: J44.9

## 2017-10-14 HISTORY — DX: Cardiomyopathy, unspecified: I42.9

## 2017-10-14 HISTORY — DX: Heart failure, unspecified: I50.9

## 2017-10-14 HISTORY — PX: ESOPHAGOGASTRODUODENOSCOPY (EGD) WITH PROPOFOL: SHX5813

## 2017-10-14 HISTORY — DX: Anemia, unspecified: D64.9

## 2017-10-14 HISTORY — DX: Personal history of other venous thrombosis and embolism: Z86.718

## 2017-10-14 SURGERY — COLONOSCOPY WITH PROPOFOL
Anesthesia: General

## 2017-10-14 MED ORDER — SODIUM CHLORIDE 0.9 % IV SOLN
INTRAVENOUS | Status: DC
  Administered 2017-10-14: 12:00:00 via INTRAVENOUS

## 2017-10-14 MED ORDER — FENTANYL CITRATE (PF) 100 MCG/2ML IJ SOLN
INTRAMUSCULAR | Status: AC
  Filled 2017-10-14: qty 2

## 2017-10-14 MED ORDER — LIDOCAINE HCL (PF) 1 % IJ SOLN: 2.0000 mL | Freq: Once | INTRAMUSCULAR | Status: DC

## 2017-10-14 MED ORDER — FENTANYL CITRATE (PF) 100 MCG/2ML IJ SOLN
INTRAMUSCULAR | Status: DC | PRN
  Administered 2017-10-14 (×2): 50 ug via INTRAVENOUS

## 2017-10-14 MED ORDER — LIDOCAINE 2% (20 MG/ML) 5 ML SYRINGE
INTRAMUSCULAR | Status: DC | PRN
  Administered 2017-10-14: 30 mg via INTRAVENOUS

## 2017-10-14 MED ORDER — PHENYLEPHRINE HCL 10 MG/ML IJ SOLN
INTRAMUSCULAR | Status: DC | PRN
  Administered 2017-10-14: 100 ug via INTRAVENOUS

## 2017-10-14 MED ORDER — PROPOFOL 500 MG/50ML IV EMUL
INTRAVENOUS | Status: AC
  Filled 2017-10-14: qty 50

## 2017-10-14 MED ORDER — PROPOFOL 10 MG/ML IV BOLUS
INTRAVENOUS | Status: DC | PRN
  Administered 2017-10-14: 100 mg via INTRAVENOUS

## 2017-10-14 MED ORDER — LIDOCAINE HCL (PF) 2 % IJ SOLN
INTRAMUSCULAR | Status: AC
  Filled 2017-10-14: qty 10

## 2017-10-14 MED ORDER — PROPOFOL 500 MG/50ML IV EMUL
INTRAVENOUS | Status: DC | PRN
  Administered 2017-10-14: 150 ug/kg/min via INTRAVENOUS

## 2017-10-14 MED ORDER — EPHEDRINE SULFATE 50 MG/ML IJ SOLN
INTRAMUSCULAR | Status: DC | PRN
  Administered 2017-10-14: 10 mg via INTRAVENOUS

## 2017-10-14 NOTE — Anesthesia Preprocedure Evaluation (Addendum)
Anesthesia Evaluation  Patient identified by MRN, date of birth, ID band Patient awake    Reviewed: Allergy & Precautions, H&P , NPO status , reviewed documented beta blocker date and time   Airway Mallampati: II  TM Distance: >3 FB Neck ROM: limited    Dental  (+) Upper Dentures, Lower Dentures   Pulmonary COPD, former smoker,     + decreased breath sounds      Cardiovascular +CHF  + dysrhythmias Atrial Fibrillation  Rhythm:irregular  08/2016 ECHO MODERATE-TO-SEVERE LV SYSTOLIC DYSFUNCTION WITH AN ESTIMATED EF = 25-30 % NORMAL RIGHT VENTRICULAR SYSTOLIC FUNCTION SEVERE TRICUSPID VALVE INSUFFICIENCY MODERATE MITRAL VALVE INSUFFICIENCY MILD AORTIC VALVE INSUFFICIENCY NO VALVULAR STENOSIS SEVERE BIATRIAL ENLARGEMENT MODERATE LV ENLARGEMENT MODERATE RV ENLARGEMENT   Neuro/Psych PSYCHIATRIC DISORDERS    GI/Hepatic GERD  Controlled and Medicated,  Endo/Other    Renal/GU      Musculoskeletal   Abdominal   Peds  Hematology  (+) anemia ,   Anesthesia Other Findings Past Medical History: No date: Anemia No date: Cardiomyopathy (HCC) No date: CHF (congestive heart failure) (HCC) No date: COPD (chronic obstructive pulmonary disease) (HCC) No date: Dysrhythmia     Comment:  Atrial Fibrillation No date: ETOH abuse No date: GERD (gastroesophageal reflux disease) No date: History of DVT of lower extremity No date: History of Helicobacter pylori infection  Past Surgical History: No date: CARDIAC CATHETERIZATION No date: COLONOSCOPY W/ POLYPECTOMY  BMI    Body Mass Index:  24.21 kg/m      Reproductive/Obstetrics                           Anesthesia Physical Anesthesia Plan  ASA: IV  Anesthesia Plan: General   Post-op Pain Management:    Induction:   PONV Risk Score and Plan: 2 and Treatment may vary due to age or medical condition and TIVA  Airway Management Planned:   Additional  Equipment:   Intra-op Plan:   Post-operative Plan:   Informed Consent: I have reviewed the patients History and Physical, chart, labs and discussed the procedure including the risks, benefits and alternatives for the proposed anesthesia with the patient or authorized representative who has indicated his/her understanding and acceptance.   Dental Advisory Given  Plan Discussed with: CRNA  Anesthesia Plan Comments:         Anesthesia Quick Evaluation

## 2017-10-14 NOTE — Transfer of Care (Signed)
Immediate Anesthesia Transfer of Care Note  Patient: Hunter Adkins  Procedure(s) Performed: COLONOSCOPY WITH PROPOFOL (N/A ) ESOPHAGOGASTRODUODENOSCOPY (EGD) WITH PROPOFOL (N/A )  Patient Location: PACU and Endoscopy Unit  Anesthesia Type:General  Level of Consciousness: sedated  Airway & Oxygen Therapy: Patient Spontanous Breathing and Patient connected to nasal cannula oxygen  Post-op Assessment: Report given to RN and Post -op Vital signs reviewed and stable  Post vital signs: Reviewed and stable  Last Vitals:  Vitals Value Taken Time  BP    Temp    Pulse    Resp    SpO2      Last Pain:  Vitals:   10/14/17 1154  TempSrc: Tympanic         Complications: No apparent anesthesia complications

## 2017-10-14 NOTE — H&P (Signed)
Primary Care Physician:  Jerl Mina, MD Primary Gastroenterologist:  Dr. Mechele Collin  Pre-Procedure History & Physical: HPI:  Hunter Adkins is a 77 y.o. male is here for an endoscopy and colonoscopy.  Done for screening colonoscopy and heartburn.   Past Medical History:  Diagnosis Date  . Anemia   . Cardiomyopathy (HCC)   . CHF (congestive heart failure) (HCC)   . COPD (chronic obstructive pulmonary disease) (HCC)   . Dysrhythmia    Atrial Fibrillation  . ETOH abuse   . GERD (gastroesophageal reflux disease)   . History of DVT of lower extremity   . History of Helicobacter pylori infection     Past Surgical History:  Procedure Laterality Date  . CARDIAC CATHETERIZATION    . COLONOSCOPY W/ POLYPECTOMY      Prior to Admission medications   Medication Sig Start Date End Date Taking? Authorizing Provider  carvedilol (COREG) 6.25 MG tablet Take 6.25 mg by mouth 2 (two) times daily with a meal.   Yes [provider]  Cholecalciferol 1000 units TBDP Take 1,000 Units by mouth daily.   Yes [provider]  folic acid (FOLVITE) 1 MG tablet Take 1 mg by mouth daily.   Yes [provider]  furosemide (LASIX) 20 MG tablet Take 20 mg by mouth daily.   Yes [provider]  lisinopril (PRINIVIL,ZESTRIL) 2.5 MG tablet Take 2.5 mg by mouth daily.   Yes [provider]  sildenafil (REVATIO) 20 MG tablet Take 20 mg by mouth daily as needed (1-5 tablets by mouth every day as needed).   Yes [provider]  simvastatin (ZOCOR) 40 MG tablet Take 40 mg by mouth daily.   Yes [provider]  vitamin B-12 (CYANOCOBALAMIN) 1000 MCG tablet Take 1,000 mcg by mouth daily.   Yes [provider]  warfarin (COUMADIN) 5 MG tablet Take 5 mg by mouth daily.   Yes [provider]    Allergies as of 08/03/2017  . (Not on File)    History reviewed. No pertinent family history.  Social History   Socioeconomic History  .  Marital status: Married    Spouse name: Not on file  . Number of children: Not on file  . Years of education: Not on file  . Highest education level: Not on file  Occupational History  . Not on file  Social Needs  . Financial resource strain: Not on file  . Food insecurity:    Worry: Not on file    Inability: Not on file  . Transportation needs:    Medical: Not on file    Non-medical: Not on file  Tobacco Use  . Smoking status: Former Smoker    Last attempt to quit: 04/08/2011    Years since quitting: 6.5  . Smokeless tobacco: Current User    Types: Chew  Substance and Sexual Activity  . Alcohol use: Yes    Alcohol/week: 1.2 oz    Types: 2 Cans of beer per week    Comment: binge daily  . Drug use: Not Currently  . Sexual activity: Not on file  Lifestyle  . Physical activity:    Days per week: Not on file    Minutes per session: Not on file  . Stress: Not on file  Relationships  . Social connections:    Talks on phone: Not on file    Gets together: Not on file    Attends religious service: Not on file    Active  member of club or organization: Not on file    Attends meetings of clubs or organizations: Not on file    Relationship status: Not on file  . Intimate partner violence:    Fear of current or ex partner: Not on file    Emotionally abused: Not on file    Physically abused: Not on file    Forced sexual activity: Not on file  Other Topics Concern  . Not on file  Social History Narrative  . Not on file    Review of Systems: See HPI, otherwise negative ROS  Physical Exam: BP 123/86   Pulse 75   Temp (!) 96.5 F (35.8 C) (Tympanic)   Resp 17   Ht 5\' 6"  (1.676 m)   Wt 68 kg (150 lb)   SpO2 99%   BMI 24.21 kg/m  General:   Alert,  pleasant and cooperative in NAD Head:  Normocephalic and atraumatic. Neck:  Supple; no masses or thyromegaly. Lungs:  Clear throughout to auscultation.    Heart:  Regular rate and rhythm. Abdomen:  Soft, nontender and  nondistended. Normal bowel sounds, without guarding, and without rebound.   Neurologic:  Alert and  oriented x4;  grossly normal neurologically.  Impression/Plan: Hunter Adkins is here for an endoscopy and colonoscopy to be performed for screening colonoscopy and heartburn.  Risks, benefits, limitations, and alternatives regarding  endoscopy and colonoscopy have been reviewed with the patient.  Questions have been answered.  All parties agreeable.   Lynnae PrudeELLIOTT, Pattiann Solanki, MD  10/14/2017, 12:33 PM

## 2017-10-14 NOTE — Anesthesia Post-op Follow-up Note (Signed)
Anesthesia QCDR form completed.        

## 2017-10-14 NOTE — Op Note (Signed)
[name] Procedure Date: 10/14/2017 12:29 PM MRN: [id number] Account #: [id number] Date of Birth: Feb 17, 1941 Admit Type: Outpatient Age: 77 Room: [facility] ENDO ROOM 2 Gender: Male Note Status: Finalized Procedure:            Colonoscopy Indications:          Screening for colorectal malignant neoplasm Providers:            Scot Jun, MD Medicines:            Propofol per Anesthesia Complications:        No immediate complications. Procedure:            Pre-Anesthesia Assessment:                       - After reviewing the risks and benefits, the patient                        was deemed in satisfactory condition to undergo the                        procedure.                       After obtaining informed consent, the colonoscope was                        passed under direct vision. Throughout the procedure,                        the patient's blood pressure, pulse, and oxygen                        saturations were monitored continuously. The                        Colonoscope was introduced through the anus and                        advanced to the the cecum, identified by appendiceal                        orifice and ileocecal valve. The colonoscopy was                        performed without difficulty. The patient tolerated the                        procedure well. The quality of the bowel preparation                        was excellent. Findings:      Internal hemorrhoids were found during endoscopy. The hemorrhoids were       small and Grade I (internal hemorrhoids that do not prolapse).      The exam was otherwise without abnormality. Impression:           - Internal hemorrhoids.                       - The examination was otherwise normal.                       -  No specimens collected. Recommendation:       - The findings and recommendations were discussed with                        the  patient's family. No further diagnostic tests                        needed. Treat esophagitis as recommended. Scot Junobert T Kandace Elrod, MD 10/14/2017 1:09:43 PM This report has been signed electronically. Number of Addenda: 0 Note Initiated On: 10/14/2017 12:29 PM Scope Withdrawal Time: 0 hours 6 minutes 51 seconds  Total Procedure Duration: 0 hours 13 minutes 14 seconds       [facility]

## 2017-10-14 NOTE — Op Note (Signed)
[facility] Gastroenterology Patient Name: Hunter Adkins Procedure Date: 10/14/2017 12:30 PM MRN: [id number] Account #: [id number] Date of Birth: 02-19-1941 Admit Type: Outpatient Age: 77 Room: [facility] ENDO ROOM 2 Gender: Male Note Status: Finalized Procedure:            Upper GI endoscopy Indications:          Heartburn Providers:            Scot Jun, MD Referring MD:         Rhona Leavens. Burnett Sheng, MD (Referring MD) Medicines:            Propofol per Anesthesia Complications:        No immediate complications. Procedure:            Pre-Anesthesia Assessment:                       - After reviewing the risks and benefits, the patient                        was deemed in satisfactory condition to undergo the                        procedure.                       After obtaining informed consent, the endoscope was                        passed under direct vision. Throughout the procedure,                        the patient's blood pressure, pulse, and oxygen                        saturations were monitored continuously. The Endoscope                        was introduced through the mouth, and advanced to the                        second part of duodenum. The upper GI endoscopy was                        accomplished without difficulty. The patient tolerated                        the procedure well. Findings:      LA Grade C (one or more mucosal breaks continuous between tops of 2 or       more mucosal folds, less than 75% circumference) esophagitis with no       bleeding was found 36 cm from the incisors.      Patchy minimal inflammation characterized by erythema and granularity       was found in the gastric antrum. Biopsies were taken with a cold forceps       for histology. Biopsies were taken with a cold forceps for Helicobacter       pylori testing.      Diffuse mild inflammation characterized by erythema and granularity was       found in the  duodenal bulb. Impression:           -  LA Grade C reflux esophagitis.                       - Gastritis. Biopsied.                       - Duodenitis. Recommendation:       - Await pathology results. Take medicine Scot Junobert T Elliott, MD 10/14/2017 12:51:19 PM This report has been signed electronically. Number of Addenda: 0 Note Initiated On: 10/14/2017 12:30 PM      [facility]

## 2017-10-15 ENCOUNTER — Encounter: Payer: Self-pay | Admitting: Unknown Physician Specialty

## 2017-10-15 LAB — SURGICAL PATHOLOGY

## 2017-10-15 NOTE — Anesthesia Postprocedure Evaluation (Signed)
Anesthesia Post Note  Patient: Hunter Adkins  Procedure(s) Performed: COLONOSCOPY WITH PROPOFOL (N/A ) ESOPHAGOGASTRODUODENOSCOPY (EGD) WITH PROPOFOL (N/A )  Patient location during evaluation: Endoscopy Anesthesia Type: General Level of consciousness: awake and alert Pain management: pain level controlled Vital Signs Assessment: post-procedure vital signs reviewed and stable Respiratory status: spontaneous breathing, nonlabored ventilation and respiratory function stable Cardiovascular status: blood pressure returned to baseline and stable Postop Assessment: no apparent nausea or vomiting Anesthetic complications: no     Last Vitals:  Vitals:   10/14/17 1322 10/14/17 1332  BP: 110/61 117/73  Pulse: 74 83  Resp: 18 20  Temp:    SpO2: 98% 98%    Last Pain:  Vitals:   10/14/17 1332  TempSrc:   PainSc: 0-No pain                 Christia ReadingScott T Harmony Sandell

## 2017-10-28 ENCOUNTER — Emergency Department: Payer: PPO

## 2017-10-28 ENCOUNTER — Other Ambulatory Visit: Payer: Self-pay

## 2017-10-28 ENCOUNTER — Emergency Department
Admission: EM | Admit: 2017-10-28 | Discharge: 2017-10-29 | Disposition: A | Discharge status: Other Acute Inpatient Hospital | Payer: PPO | Attending: Emergency Medicine | Admitting: Emergency Medicine

## 2017-10-28 DIAGNOSIS — Z79899 Other long term (current) drug therapy: Secondary | ICD-10-CM | POA: Insufficient documentation

## 2017-10-28 DIAGNOSIS — R4182 Altered mental status, unspecified: Secondary | ICD-10-CM | POA: Diagnosis not present

## 2017-10-28 DIAGNOSIS — Z9889 Other specified postprocedural states: Secondary | ICD-10-CM | POA: Diagnosis not present

## 2017-10-28 DIAGNOSIS — Z7901 Long term (current) use of anticoagulants: Secondary | ICD-10-CM | POA: Insufficient documentation

## 2017-10-28 DIAGNOSIS — J410 Simple chronic bronchitis: Secondary | ICD-10-CM | POA: Diagnosis not present

## 2017-10-28 DIAGNOSIS — I639 Cerebral infarction, unspecified: Secondary | ICD-10-CM | POA: Diagnosis not present

## 2017-10-28 DIAGNOSIS — Z86718 Personal history of other venous thrombosis and embolism: Secondary | ICD-10-CM | POA: Diagnosis not present

## 2017-10-28 DIAGNOSIS — I420 Dilated cardiomyopathy: Secondary | ICD-10-CM | POA: Diagnosis not present

## 2017-10-28 DIAGNOSIS — I509 Heart failure, unspecified: Secondary | ICD-10-CM | POA: Diagnosis not present

## 2017-10-28 DIAGNOSIS — F1010 Alcohol abuse, uncomplicated: Secondary | ICD-10-CM | POA: Diagnosis not present

## 2017-10-28 DIAGNOSIS — Z87891 Personal history of nicotine dependence: Secondary | ICD-10-CM | POA: Insufficient documentation

## 2017-10-28 DIAGNOSIS — R531 Weakness: Secondary | ICD-10-CM | POA: Diagnosis present

## 2017-10-28 DIAGNOSIS — I482 Chronic atrial fibrillation: Secondary | ICD-10-CM | POA: Diagnosis not present

## 2017-10-28 DIAGNOSIS — J449 Chronic obstructive pulmonary disease, unspecified: Secondary | ICD-10-CM | POA: Diagnosis not present

## 2017-10-28 DIAGNOSIS — I5022 Chronic systolic (congestive) heart failure: Secondary | ICD-10-CM | POA: Diagnosis not present

## 2017-10-28 DIAGNOSIS — R29818 Other symptoms and signs involving the nervous system: Secondary | ICD-10-CM | POA: Diagnosis not present

## 2017-10-28 DIAGNOSIS — R29810 Facial weakness: Secondary | ICD-10-CM | POA: Diagnosis not present

## 2017-10-28 LAB — COMPREHENSIVE METABOLIC PANEL
ALT: 13 U/L (ref 0–44)
AST: 21 U/L (ref 15–41)
Albumin: 4 g/dL (ref 3.5–5.0)
Alkaline Phosphatase: 58 U/L (ref 38–126)
Anion gap: 9 (ref 5–15)
BUN: 17 mg/dL (ref 8–23)
CHLORIDE: 105 mmol/L (ref 98–111)
CO2: 23 mmol/L (ref 22–32)
CREATININE: 1.25 mg/dL — AB (ref 0.61–1.24)
Calcium: 8.7 mg/dL — ABNORMAL LOW (ref 8.9–10.3)
GFR, EST NON AFRICAN AMERICAN: 55 mL/min — AB (ref >60)
Glucose, Bld: 106 mg/dL — ABNORMAL HIGH (ref 70–99)
POTASSIUM: 3.5 mmol/L (ref 3.5–5.1)
Sodium: 137 mmol/L (ref 135–145)
Total Bilirubin: 0.8 mg/dL (ref 0.3–1.2)
Total Protein: 7.5 g/dL (ref 6.5–8.1)

## 2017-10-28 LAB — DIFFERENTIAL
BASOS ABS: 0.1 10*3/uL (ref 0–0.1)
BASOS PCT: 1 %
EOS ABS: 0.1 10*3/uL (ref 0–0.7)
Eosinophils Relative: 2 %
Lymphocytes Relative: 29 %
Lymphs Abs: 1.8 10*3/uL (ref 1.0–3.6)
MONO ABS: 0.7 10*3/uL (ref 0.2–1.0)
MONOS PCT: 11 %
Neutro Abs: 3.6 10*3/uL (ref 1.4–6.5)
Neutrophils Relative %: 57 %

## 2017-10-28 LAB — CBC
HEMATOCRIT: 39.1 % — AB (ref 40.0–52.0)
Hemoglobin: 13.5 g/dL (ref 13.0–18.0)
MCH: 30.5 pg (ref 26.0–34.0)
MCHC: 34.4 g/dL (ref 32.0–36.0)
MCV: 88.6 fL (ref 80.0–100.0)
Platelets: 232 10*3/uL (ref 150–440)
RBC: 4.42 MIL/uL (ref 4.40–5.90)
RDW: 13.2 % (ref 11.5–14.5)
WBC: 6.3 10*3/uL (ref 3.8–10.6)

## 2017-10-28 LAB — LIPASE, BLOOD: Lipase: 41 U/L (ref 11–51)

## 2017-10-28 LAB — TROPONIN I

## 2017-10-28 LAB — GLUCOSE, CAPILLARY: Glucose-Capillary: 102 mg/dL — ABNORMAL HIGH (ref 70–99)

## 2017-10-28 LAB — ETHANOL

## 2017-10-28 LAB — APTT: aPTT: 42 seconds — ABNORMAL HIGH (ref 24–36)

## 2017-10-28 LAB — PROTIME-INR
INR: 2.61
Prothrombin Time: 27.7 seconds — ABNORMAL HIGH (ref 11.4–15.2)

## 2017-10-28 NOTE — ED Triage Notes (Signed)
Pt was at the bar with family and daughter in law stated that around 2226, someone spoke to the patient and he did not answer and when she went over to here, he could not speak to her and he was falling over to the right side and they had to hold patient up in the chair. Daughter in law stated that patient did not finished one beer tonight. Pt does take blood thinner, hx of stroke and MI.

## 2017-10-28 NOTE — ED Notes (Signed)
Patient transported to CT 

## 2017-10-28 NOTE — ED Notes (Signed)
Pt is able to move leg side and can mumble some words out but is not able to follow simple commands at this time.

## 2017-10-28 NOTE — Progress Notes (Signed)
CODE STROKE- PHARMACY COMMUNICATION   Time CODE STROKE called/page received: 2312  Time response to CODE STROKE was made (in person or via phone):   Time Stroke Kit retrieved from [place] (only if needed): n/a  Name of Provider/Nurse contacted: Brittney (patient was on way to CT)  Past Medical History:  Diagnosis Date  . Anemia   . Cardiomyopathy ([place])   . CHF (congestive heart failure) ([place])   . COPD (chronic obstructive pulmonary disease) ([place])   . Dysrhythmia    Atrial Fibrillation  . ETOH abuse   . GERD (gastroesophageal reflux disease)   . History of DVT of lower extremity   . History of Helicobacter pylori infection    Prior to Admission medications   Medication Sig Start Date End Date Taking? Authorizing Provider  carvedilol (COREG) 6.25 MG tablet Take 6.25 mg by mouth 2 (two) times daily with a meal.    [provider]  Cholecalciferol 1000 units TBDP Take 1,000 Units by mouth daily.    [provider]  folic acid (FOLVITE) 1 MG tablet Take 1 mg by mouth daily.    [provider]  furosemide (LASIX) 20 MG tablet Take 20 mg by mouth daily.    [provider]  lisinopril (PRINIVIL,ZESTRIL) 2.5 MG tablet Take 2.5 mg by mouth daily.    [provider]  omeprazole (PRILOSEC) 40 MG capsule Take 40 mg by mouth daily. 10/14/17   [provider]  sildenafil (REVATIO) 20 MG tablet Take 20 mg by mouth daily as needed (1-5 tablets by mouth every day as needed).    [provider]  simvastatin (ZOCOR) 40 MG tablet Take 40 mg by mouth daily.    [provider]  vitamin B-12 (CYANOCOBALAMIN) 1000 MCG tablet Take 1,000 mcg by mouth daily.    [provider]  warfarin (COUMADIN) 5 MG tablet Take 5 mg by mouth daily.    [provider]    Tobie Lords ,PharmD Clinical Pharmacist  10/28/2017  11:18 PM

## 2017-10-28 NOTE — Consult Note (Signed)
   [employer] TeleNeurology Consult Services  Impression:  Patient with mutism, leftward gaze, right-sided weakness/sensory loss concerning for LMCA ischemia. CTA show LICA occlusion near the origin.  He does have a fair amount of penumbra on CTP, though this was a limited study.  Discussed with neurorads who felt there was enough penumbra to attempt thrombectomy.  [facility] and patient accepted in transfer for thrombectomy evaluation and further management.   Not a tpa candidate due to: INR 2.6   Differential Diagnosis:   1. Cardioembolic stroke  2. Small vessel disease/lacune  3. Thromboembolic, artery-to-artery mechanism  4. Hypercoagulable state-related infarct  5. Transient ischemic attack  6. Thrombotic mechanism, large artery disease   Comments:   Door time: 2310 [employer] contacted: [id number] [employer] at bedside: 2349 NIHSS assessment time: 2352  Recommendations:   inpatient neurology consultation Inpatient stroke evaluation as per Neurology/ Internal Medicine Discussed with ED MD  -----------------------------------------------------------------------------------------  CC: stroke alert  History of Present Illness  Patient is a 77 year old man with a history of afib and DVT on warfarin presenting with aphasia.  Last normal at 2226 when he finished playing pool.  Thereafter he became acutely mute with right-sided weakness.  Diagnostic: CT head wo - hyperdense LM1, subtle left temporal hypodensity CTA head/neck - acute appearing left ICA occlusion just past bifurcation with loss of flow into the LMCA CTP brain - poor study, but appears to be a fair amount of penumbra in the LMCA territory with a core infarct present  Exam: NIHSS score: 26 1a LOC: 0  1b Questions: 2 1c Commands: 2 2 Gaze: 2 3 VF: 0  4 Face: 1 5a Motor arm left: 2 5b Motor arm right: 4 6a Motor leg left: 2 6b Motor leg right: 4  7 Ataxia: 0  8  Sensory: 2  9: Language: 3 10: Speech: 2 11: Extinction: 0       Medical Decision Making:  - Extensive number of diagnosis or management options are considered above.   - Extensive amount of complex data reviewed.   - High risk of complication and/or morbidity or mortality are associated with differential diagnostic considerations above.  - There may be Uncertain outcome and increased probability of prolonged functional impairment or high probability of severe prolonged functional impairment associated with some of these differential diagnosis.   Medical Data Reviewed:  1.Data reviewed include clinical labs, radiology,  Medical Tests;   2.Tests results discussed w/performing or interpreting physician;   3.Obtaining/reviewing old medical records;  4.Obtaining case history from another source;  5.Independent review of image, tracing or specimen.    Patient was informed the Neurology Consult would happen via TeleHealth consult by way of interactive audio and video telecommunications and consented to receiving care in this manner.

## 2017-10-28 NOTE — ED Provider Notes (Signed)
[facility] Emergency Department Provider Note   ____________________________________________   First MD Initiated Contact with Patient 10/28/17 2312     (approximate)  I have reviewed the triage vital signs and the nursing notes.   HISTORY  Chief Complaint Cerebrovascular Accident  Level 5 caveat: History limited by severe aphasia  HPI Hunter Adkins is a 77 y.o. male with a history of CVA, on warfarin for atrial fibrillation, who was drinking at a bar with his family when they noted sudden onset of right-sided weakness at 10:26 PM.  Arrives to the ED with right-sided hemiplegia and severe aphasia, unable to speak nor cooperate with examination.  Family not yet at bedside.   Past Medical History:  Diagnosis Date  . Anemia   . Cardiomyopathy (HCC)   . CHF (congestive heart failure) (HCC)   . COPD (chronic obstructive pulmonary disease) (HCC)   . Dysrhythmia    Atrial Fibrillation  . ETOH abuse   . GERD (gastroesophageal reflux disease)   . History of DVT of lower extremity   . History of Helicobacter pylori infection     There are no active problems to display for this patient.   Past Surgical History:  Procedure Laterality Date  . CARDIAC CATHETERIZATION    . COLONOSCOPY W/ POLYPECTOMY    . COLONOSCOPY WITH PROPOFOL N/A 10/14/2017   Procedure: COLONOSCOPY WITH PROPOFOL;  Surgeon: Scot Jun, MD;  Location: [facility] ENDOSCOPY;  Service: Endoscopy;  Laterality: N/A;  . ESOPHAGOGASTRODUODENOSCOPY (EGD) WITH PROPOFOL N/A 10/14/2017   Procedure: ESOPHAGOGASTRODUODENOSCOPY (EGD) WITH PROPOFOL;  Surgeon: Scot Jun, MD;  Location: [facility] ENDOSCOPY;  Service: Endoscopy;  Laterality: N/A;    Prior to Admission medications   Medication Sig Start Date End Date Taking? Authorizing Provider  carvedilol (COREG) 6.25 MG tablet Take 6.25 mg by mouth 2 (two) times daily with a meal.   Yes [provider]  Cholecalciferol 1000 units tablet  Take 1,000 Units by mouth daily.    Yes [provider]  folic acid (FOLVITE) 1 MG tablet Take 1 mg by mouth daily.   Yes [provider]  furosemide (LASIX) 20 MG tablet Take 20 mg by mouth daily.   Yes [provider]  lisinopril (PRINIVIL,ZESTRIL) 2.5 MG tablet Take 2.5 mg by mouth daily.   Yes [provider]  omeprazole (PRILOSEC) 40 MG capsule Take 40 mg by mouth daily. 10/14/17  Yes [provider]  sildenafil (REVATIO) 20 MG tablet Take 20-100 mg by mouth daily as needed.    Yes [provider]  simvastatin (ZOCOR) 40 MG tablet Take 40 mg by mouth daily.   Yes [provider]  vitamin B-12 (CYANOCOBALAMIN) 1000 MCG tablet Take 1,000 mcg by mouth daily.   Yes [provider]  warfarin (COUMADIN) 5 MG tablet Take 5 mg by mouth daily.   Yes [provider]    Allergies Patient has no known allergies.  History reviewed. No pertinent family history.  Social History Social History   Tobacco Use  . Smoking status: Former Smoker    Last attempt to quit: 04/08/2011    Years since quitting: 6.5  . Smokeless tobacco: Current User    Types: Chew  Substance Use Topics  . Alcohol use: Yes    Alcohol/week: 1.2 oz    Types: 2 Cans of beer per week    Comment: binge daily  . Drug use: Not Currently    Review of Systems  Constitutional: No fever/chills  Eyes: No visual changes. ENT: No sore throat. Cardiovascular: Denies chest pain. Respiratory: Denies shortness of breath. Gastrointestinal: No abdominal pain.  No nausea, no vomiting.  No diarrhea.  No constipation. Genitourinary: Negative for dysuria. Musculoskeletal: Negative for back pain. Skin: Negative for rash. Neurological: Positive for right-sided weakness and aphasia.  ____________________________________________   PHYSICAL EXAM:  VITAL SIGNS: ED Triage Vitals  Enc Vitals Group     BP      Pulse      Resp      Temp      Temp src       SpO2      Weight      Height      Head Circumference      Peak Flow      Pain Score      Pain Loc      Pain Edu?      Excl. in GC?     Constitutional: Severely aphasic unable to speak nor cooperate with simple commands.   Eyes: Conjunctivae are normal. PERRL. EOMI. Head: Atraumatic. Nose: No external evidence of injury. Mouth/Throat: Mucous membranes are moist.  Edentulous. Neck: No stridor.  No cervical spine tenderness to palpation. Cardiovascular: Normal rate, irregular rhythm. Grossly normal heart sounds.  Good peripheral circulation. Respiratory: Normal respiratory effort.  No retractions. Lungs CTAB. Gastrointestinal: Soft and nontender. No distention. No abdominal bruits. No CVA tenderness. Musculoskeletal: No lower extremity tenderness nor edema.  No joint effusions. Neurologic: Severe aphasia, unable to communicate.  Cannot follow simple commands.  Right sided facial droop.  Right upper and lower extremity hemiplegia.   Skin:  Skin is warm, dry and intact. No rash noted. Psychiatric: Unable to assess. ____________________________________________   LABS (all labs ordered are listed, but only abnormal results are displayed)  Labs Reviewed  PROTIME-INR - Abnormal; Notable for the following components:      Result Value   Prothrombin Time 27.7 (*)    All other components within normal limits  APTT - Abnormal; Notable for the following components:   aPTT 42 (*)    All other components within normal limits  CBC - Abnormal; Notable for the following components:   HCT 39.1 (*)    All other components within normal limits  COMPREHENSIVE METABOLIC PANEL - Abnormal; Notable for the following components:   Glucose, Bld 106 (*)    Creatinine, Ser 1.25 (*)    Calcium 8.7 (*)    GFR calc non Af Amer 55 (*)    All other components within normal limits  GLUCOSE, CAPILLARY - Abnormal; Notable for the following components:   Glucose-Capillary 102 (*)    All other components  within normal limits  ETHANOL  DIFFERENTIAL  TROPONIN I  LIPASE, BLOOD  URINE DRUG SCREEN, QUALITATIVE ([facility] ONLY)  URINALYSIS, ROUTINE W REFLEX MICROSCOPIC   ____________________________________________  EKG  ED ECG REPORT I, SUNG,JADE J, the attending physician, personally viewed and interpreted this ECG.   Date: 10/28/2017  EKG Time: 2312  Rate: 64  Rhythm: atrial fibrillation, rate 64  Axis: Normal  Intervals:right bundle branch block  ST&T Change: Nonspecific  ____________________________________________  RADIOLOGY  ED MD interpretation: CT head without contrast discussed with Dr. Phill Myron: Suspicious for a large left LVO occlusion  Official radiology report(s): Ct Angio Head W Or Wo Contrast  Result Date: 11/28/2017 CLINICAL DATA:  Initial evaluation for EXAM: CT ANGIOGRAPHY HEAD AND NECK CT PERFUSION BRAIN TECHNIQUE: Multidetector CT imaging of the head and neck was performed using  the standard protocol during bolus administration of intravenous contrast. Multiplanar CT image reconstructions and MIPs were obtained to evaluate the vascular anatomy. Carotid stenosis measurements (when applicable) are obtained utilizing NASCET criteria, using the distal internal carotid diameter as the denominator. Multiphase CT imaging of the brain was performed following IV bolus contrast injection. Subsequent parametric perfusion maps were calculated using RAPID software. CONTRAST:  OMNIPAQUE IOHEXOL 350 MG/ML SOLN COMPARISON:  Prior head CT from earlier the same day. FINDINGS: CTA NECK FINDINGS Aortic arch: Visualized aortic arch of normal caliber with normal 3 vessel morphology. Atheromatous plaque about the use arch and origin of the great vessels without hemodynamically significant stenosis. Visualized subclavian arteries widely patent Right carotid system: Scattered atheromatous plaque within the right common carotid artery without stenosis. Atherosclerotic change about the right  bifurcation/proximal right ICA without significant stenosis. Right ICA widely patent distally to the skull base without stenosis, dissection, or occlusion. Left carotid system: Left common carotid artery patent from its origin to the bifurcation without significant stenosis. Abrupt occlusion of the left ICA just distal to the bifurcation. Left ICA remains occluded within the neck. Vertebral arteries: Both of the vertebral arteries arise from the subclavian arteries. Left vertebral artery dominant. Vertebral arteries patent within the neck without stenosis, dissection, or occlusion. Skeleton: No acute osseus abnormality. No discrete lytic or blastic osseous lesions. Moderate cervical spondylolysis at C4-5 through C6-7. Other neck: No other acute abnormality within the neck. Upper chest: Visualized upper chest demonstrates no acute finding. Severe emphysema. Review of the MIP images confirms the above findings CTA HEAD FINDINGS Anterior circulation: Left ICA remains occluded to the terminus. Left M1 segment and proximal left MCA branches occluded. Scant collateral flow seen distally. Multifocal atheromatous plaque throughout the cavernous/supraclinoid right ICA with moderate multifocal stenosis right M1 widely patent. No proximal right M2 occlusion distal right MCA branches demonstrate atheromatous irregularity but are perfused to their distal aspects. Right A1 patent. Retrograde opacification of the left A1 which appears slightly hypoplastic. Azygos ACA widely patent to its distal aspects. Posterior circulation: Vertebral arteries patent to the vertebrobasilar junction without stenosis. Left vertebral artery dominant posterior inferior cerebral arteries patent bilaterally. Basilar patent to its distal aspect without stenosis. Superior cerebral arteries patent bilaterally. Both of the PCAs patent to their distal aspects. Prominent posterior communicating arteries noted bilaterally. Venous sinuses: Patent. Anatomic  variants: None significant. Delayed phase: 5 mm focus of parenchymal enhancement along the gray-white matter differentiation of the anterior right frontal lobe noted (series 13, image 20), indeterminate, but favored to be vascular in nature. No surrounding edema or other abnormality. No other abnormal enhancement. Review of the MIP images confirms the above findings CT Brain Perfusion Findings: CBF (<30%) Volume: 40mL Perfusion (Tmax>6.0s) volume: Mismatch Volume: Infarction Location:Evidence for acute ischemic infarct involving the left MCA distribution, with involvement of the left insular region and subcortical and deep white matter of the left cerebral hemisphere. Surrounding ischemic penumbra involves much of the left cerebral hemisphere, with additional scattered perfusion abnormality within the right cerebral hemisphere. Findings are felt to be at least partially artifactual in nature. IMPRESSION: 1. Acute EVLO with occlusion of the left ICA just distal to the bifurcation. Left ICA remains occluded to the terminus with absent flow within the left MCA artery. Fairly mild collateralization seen distally within the left MCA distribution. 2. Evidence for core infarct involving the left cerebral hemisphere as above. Surrounding ischemic penumbra with perfusion mismatch volume of 483 mL, suspected to at least  in part be artifactual in nature on this examination. However, a fairly substantial surrounding penumbra is still somewhat suspected. 3. Patent azygos ACA. 4. Moderate atheromatous plaque throughout the right carotid siphon with moderate multifocal stenosis. No other high-grade or hemodynamically significant stenosis identified. 5. Emphysema. Critical Value/emergent results were called by telephone at the time of interpretation on 10/31/2017 at 12:35 am to Dr. Reesa ChewJASON SEBESTO , who verbally acknowledged these results. Electronically Signed   By: Rise MuBenjamin  McClintock M.D.   On: 10/30/2017 00:56   Ct  Angio Neck W Or Wo Contrast  Result Date: 11/14/2017 CLINICAL DATA:  Initial evaluation for EXAM: CT ANGIOGRAPHY HEAD AND NECK CT PERFUSION BRAIN TECHNIQUE: Multidetector CT imaging of the head and neck was performed using the standard protocol during bolus administration of intravenous contrast. Multiplanar CT image reconstructions and MIPs were obtained to evaluate the vascular anatomy. Carotid stenosis measurements (when applicable) are obtained utilizing NASCET criteria, using the distal internal carotid diameter as the denominator. Multiphase CT imaging of the brain was performed following IV bolus contrast injection. Subsequent parametric perfusion maps were calculated using RAPID software. CONTRAST:  100mL OMNIPAQUE IOHEXOL 350 MG/ML SOLN COMPARISON:  Prior head CT from earlier the same day. FINDINGS: CTA NECK FINDINGS Aortic arch: Visualized aortic arch of normal caliber with normal 3 vessel morphology. Atheromatous plaque about the use arch and origin of the great vessels without hemodynamically significant stenosis. Visualized subclavian arteries widely patent Right carotid system: Scattered atheromatous plaque within the right common carotid artery without stenosis. Atherosclerotic change about the right bifurcation/proximal right ICA without significant stenosis. Right ICA widely patent distally to the skull base without stenosis, dissection, or occlusion. Left carotid system: Left common carotid artery patent from its origin to the bifurcation without significant stenosis. Abrupt occlusion of the left ICA just distal to the bifurcation. Left ICA remains occluded within the neck. Vertebral arteries: Both of the vertebral arteries arise from the subclavian arteries. Left vertebral artery dominant. Vertebral arteries patent within the neck without stenosis, dissection, or occlusion. Skeleton: No acute osseus abnormality. No discrete lytic or blastic osseous lesions. Moderate cervical spondylolysis at C4-5  through C6-7. Other neck: No other acute abnormality within the neck. Upper chest: Visualized upper chest demonstrates no acute finding. Severe emphysema. Review of the MIP images confirms the above findings CTA HEAD FINDINGS Anterior circulation: Left ICA remains occluded to the terminus. Left M1 segment and proximal left MCA branches occluded. Scant collateral flow seen distally. Multifocal atheromatous plaque throughout the cavernous/supraclinoid right ICA with moderate multifocal stenosis right M1 widely patent. No proximal right M2 occlusion distal right MCA branches demonstrate atheromatous irregularity but are perfused to their distal aspects. Right A1 patent. Retrograde opacification of the left A1 which appears slightly hypoplastic. Azygos ACA widely patent to its distal aspects. Posterior circulation: Vertebral arteries patent to the vertebrobasilar junction without stenosis. Left vertebral artery dominant posterior inferior cerebral arteries patent bilaterally. Basilar patent to its distal aspect without stenosis. Superior cerebral arteries patent bilaterally. Both of the PCAs patent to their distal aspects. Prominent posterior communicating arteries noted bilaterally. Venous sinuses: Patent. Anatomic variants: None significant. Delayed phase: 5 mm focus of parenchymal enhancement along the gray-white matter differentiation of the anterior right frontal lobe noted (series 13, image 20), indeterminate, but favored to be vascular in nature. No surrounding edema or other abnormality. No other abnormal enhancement. Review of the MIP images confirms the above findings CT Brain Perfusion Findings: CBF (<30%) Volume: 40mL Perfusion (Tmax>6.0s) volume: 523mL Mismatch Volume:  Infarction Location:Evidence for acute ischemic infarct involving the left MCA distribution, with involvement of the left insular region and subcortical and deep white matter of the left cerebral hemisphere. Surrounding ischemic  penumbra involves much of the left cerebral hemisphere, with additional scattered perfusion abnormality within the right cerebral hemisphere. Findings are felt to be at least partially artifactual in nature. IMPRESSION: 1. Acute EVLO with occlusion of the left ICA just distal to the bifurcation. Left ICA remains occluded to the terminus with absent flow within the left MCA artery. Fairly mild collateralization seen distally within the left MCA distribution. 2. Evidence for core infarct involving the left cerebral hemisphere as above. Surrounding ischemic penumbra with perfusion mismatch volume of 483 mL, suspected to at least in part be artifactual in nature on this examination. However, a fairly substantial surrounding penumbra is still somewhat suspected. 3. Patent azygos ACA. 4. Moderate atheromatous plaque throughout the right carotid siphon with moderate multifocal stenosis. No other high-grade or hemodynamically significant stenosis identified. 5. Emphysema. Critical Value/emergent results were called by telephone at the time of interpretation on 11/11/17 at 12:35 am to Dr. Reesa Chew , who verbally acknowledged these results. Electronically Signed   By: Rise Mu M.D.   On: 11/11/2017 00:56   Ct Cerebral Perfusion W Contrast  Result Date: 11-11-17 CLINICAL DATA:  Initial evaluation for EXAM: CT ANGIOGRAPHY HEAD AND NECK CT PERFUSION BRAIN TECHNIQUE: Multidetector CT imaging of the head and neck was performed using the standard protocol during bolus administration of intravenous contrast. Multiplanar CT image reconstructions and MIPs were obtained to evaluate the vascular anatomy. Carotid stenosis measurements (when applicable) are obtained utilizing NASCET criteria, using the distal internal carotid diameter as the denominator. Multiphase CT imaging of the brain was performed following IV bolus contrast injection. Subsequent parametric perfusion maps were calculated using RAPID software.  CONTRAST:  OMNIPAQUE IOHEXOL 350 MG/ML SOLN COMPARISON:  Prior head CT from earlier the same day. FINDINGS: CTA NECK FINDINGS Aortic arch: Visualized aortic arch of normal caliber with normal 3 vessel morphology. Atheromatous plaque about the use arch and origin of the great vessels without hemodynamically significant stenosis. Visualized subclavian arteries widely patent Right carotid system: Scattered atheromatous plaque within the right common carotid artery without stenosis. Atherosclerotic change about the right bifurcation/proximal right ICA without significant stenosis. Right ICA widely patent distally to the skull base without stenosis, dissection, or occlusion. Left carotid system: Left common carotid artery patent from its origin to the bifurcation without significant stenosis. Abrupt occlusion of the left ICA just distal to the bifurcation. Left ICA remains occluded within the neck. Vertebral arteries: Both of the vertebral arteries arise from the subclavian arteries. Left vertebral artery dominant. Vertebral arteries patent within the neck without stenosis, dissection, or occlusion. Skeleton: No acute osseus abnormality. No discrete lytic or blastic osseous lesions. Moderate cervical spondylolysis at C4-5 through C6-7. Other neck: No other acute abnormality within the neck. Upper chest: Visualized upper chest demonstrates no acute finding. Severe emphysema. Review of the MIP images confirms the above findings CTA HEAD FINDINGS Anterior circulation: Left ICA remains occluded to the terminus. Left M1 segment and proximal left MCA branches occluded. Scant collateral flow seen distally. Multifocal atheromatous plaque throughout the cavernous/supraclinoid right ICA with moderate multifocal stenosis right M1 widely patent. No proximal right M2 occlusion distal right MCA branches demonstrate atheromatous irregularity but are perfused to their distal aspects. Right A1 patent. Retrograde opacification of  the left A1 which appears slightly hypoplastic. Azygos ACA widely  patent to its distal aspects. Posterior circulation: Vertebral arteries patent to the vertebrobasilar junction without stenosis. Left vertebral artery dominant posterior inferior cerebral arteries patent bilaterally. Basilar patent to its distal aspect without stenosis. Superior cerebral arteries patent bilaterally. Both of the PCAs patent to their distal aspects. Prominent posterior communicating arteries noted bilaterally. Venous sinuses: Patent. Anatomic variants: None significant. Delayed phase: 5 mm focus of parenchymal enhancement along the gray-white matter differentiation of the anterior right frontal lobe noted (series 13, image 20), indeterminate, but favored to be vascular in nature. No surrounding edema or other abnormality. No other abnormal enhancement. Review of the MIP images confirms the above findings CT Brain Perfusion Findings: CBF (<30%) Volume: 40mL Perfusion (Tmax>6.0s) volume: Mismatch Volume: Infarction Location:Evidence for acute ischemic infarct involving the left MCA distribution, with involvement of the left insular region and subcortical and deep white matter of the left cerebral hemisphere. Surrounding ischemic penumbra involves much of the left cerebral hemisphere, with additional scattered perfusion abnormality within the right cerebral hemisphere. Findings are felt to be at least partially artifactual in nature. IMPRESSION: 1. Acute EVLO with occlusion of the left ICA just distal to the bifurcation. Left ICA remains occluded to the terminus with absent flow within the left MCA artery. Fairly mild collateralization seen distally within the left MCA distribution. 2. Evidence for core infarct involving the left cerebral hemisphere as above. Surrounding ischemic penumbra with perfusion mismatch volume of 483 mL, suspected to at least in part be artifactual in nature on this examination. However, a fairly  substantial surrounding penumbra is still somewhat suspected. 3. Patent azygos ACA. 4. Moderate atheromatous plaque throughout the right carotid siphon with moderate multifocal stenosis. No other high-grade or hemodynamically significant stenosis identified. 5. Emphysema. Critical Value/emergent results were called by telephone at the time of interpretation on 11/24/2017 at 12:35 am to Dr. Reesa Chew , who verbally acknowledged these results. Electronically Signed   By: Rise Mu M.D.   On: 11/01/2017 00:56   Dg Chest Port 1 View  Result Date: 11/25/2017 CLINICAL DATA:  Initial evaluation for acute stroke. EXAM: PORTABLE CHEST 1 VIEW COMPARISON:  None available. FINDINGS: Cardiomegaly. Mediastinal silhouette within normal limits. Aortic atherosclerosis. Lungs normally inflated. Underlying emphysematous changes. Diffusely increased vascular congestion with interstitial prominence suggests a degree of superimposed pulmonary interstitial edema. No consolidative airspace disease. No pleural effusion. No pneumothorax. No acute osseus abnormality. IMPRESSION: 1. Cardiomegaly with mild diffuse pulmonary interstitial edema/congestion. 2. Underlying emphysema. 3. Aortic atherosclerosis. Electronically Signed   By: Rise Mu M.D.   On: 10/31/2017 01:16   Ct Head Code Stroke Wo Contrast  Result Date: 10/28/2017 CLINICAL DATA:  Code stroke. Initial evaluation for acute right-sided weakness. EXAM: CT HEAD WITHOUT CONTRAST TECHNIQUE: Contiguous axial images were obtained from the base of the skull through the vertex without intravenous contrast. COMPARISON:  None available. FINDINGS: Brain: Generalized age-related cerebral atrophy with chronic small vessel ischemic disease. Remote lacunar infarcts within the left basal ganglia and thalamus. There is subtle asymmetric hypodensity at the posterior left insula extending posteriorly into the left M6 territory, with additional possible hypodensity within  the left M2 territory, concerning for evolving acute ischemic left MCA territory infarct. No acute intracranial hemorrhage. No mass lesion, midline shift or mass effect. No hydrocephalus. No extra-axial fluid collection. Vascular: Asymmetric hyperdensity involving the left M1 segment extending into proximal left M2 branches, concerning for low thrombus in large vessel occlusion. Scattered vascular calcifications noted within the carotid siphons. Skull: Scalp soft  tissues and calvarium within normal limits. Sinuses/Orbits: Globes and orbital soft tissues within normal limits. Paranasal sinuses and mastoids are clear. Other: None. ASPECTS [facility] Stroke Program Early CT Score) - Ganglionic level infarction (caudate, lentiform nuclei, internal capsule, insula, M1-M3 cortex): 5 - Supraganglionic infarction (M4-M6 cortex): 2 Total score (0-10 with 10 being normal): 7 IMPRESSION: 1. Asymmetric hyperdensity within the left M1 segment, concerning for large vessel occlusion. Subtle early hypodensity within the left cerebral hemisphere concerning for acute left MCA territory infarct. No acute intracranial hemorrhage. 2. ASPECTS is 7. Critical Value/emergent results were called by telephone at the time of interpretation on 10/28/2017 at 11:40 pm to Dr. Lesly Rubenstein SUNG , who verbally acknowledged these results. Electronically Signed   By: Rise Mu M.D.   On: 10/28/2017 23:42    ____________________________________________   PROCEDURES  Procedure(s) performed:   NIH Stroke Scale  Interval: Upon ED arrival Time: 1:28 AM Person Administering Scale: SUNG,JADE J  Administer stroke scale items in the order listed. Record performance in each category after each subscale exam. Do not go back and change scores. Follow directions provided for each exam technique. Scores should reflect what the patient does, not what the clinician thinks the patient can do. The clinician should record answers while administering the  exam and work quickly. Except where indicated, the patient should not be coached (i.e., repeated requests to patient to make a special effort).   1a  Level of consciousness: 1=not alert but arousable by minor stimulation to obey, answer or respond  1b. LOC questions:  2=Performs neither task correctly  1c. LOC commands: 2=Performs neither task correctly  2.  Best Gaze: 0=normal  3.  Visual: 0=No visual loss  4. Facial Palsy: 2=Partial paralysis (total or near total paralysis of the lower face)  5a.  Motor left arm: 0=No drift, limb holds 90 (or 45) degrees for full 10 seconds  5b.  Motor right arm: 4=No movement  6a. motor left leg: 0=No drift, limb holds 90 (or 45) degrees for full 10 seconds  6b  Motor right leg:  4=No movement  7. Limb Ataxia: 1=Present in one limb  8.  Sensory: 1=Mild to moderate sensory loss; patient feels pinprick is less sharp or is dull on the affected side; there is a loss of superficial pain with pinprick but patient is aware He is being touched  9. Best Language:  3=Mute, global aphasia; no usable speech or auditory comprehension  10. Dysarthria: 2=Severe; patient speech is so slurred as to be unintelligible in the absence of or our of proportion to any dysphagia, or is mute/anarthric  11. Extinction and Inattention: 0=No abnormality  12. Distal motor function: 1=At least some extension after 5 seconds, but is not fully extended. Any movement of the fingers which is not in response to a command is not scored   Total:   23   Procedures  Critical Care performed: Yes, see critical care note(s)   CRITICAL CARE Performed by: Irean Hong   Total critical care time: 45 minutes  Critical care time was exclusive of separately billable procedures and treating other patients.  Critical care was necessary to treat or prevent imminent or life-threatening deterioration.  Critical care was time spent personally by me on the following activities: development of  treatment plan with patient and/or surrogate as well as nursing, discussions with consultants, evaluation of patient's response to treatment, examination of patient, obtaining history from patient or surrogate, ordering and performing treatments and interventions, ordering and  review of laboratory studies, ordering and review of radiographic studies, pulse oximetry and re-evaluation of patient's condition.  ____________________________________________   INITIAL IMPRESSION / ASSESSMENT AND PLAN / ED COURSE  As part of my medical decision making, I reviewed the following data within the electronic MEDICAL RECORD NUMBER Nursing notes reviewed and incorporated, Labs reviewed, EKG interpreted, Old chart reviewed, Radiograph reviewed, A consult was requested and obtained from this/these consultant(s) Neurology and Notes from prior ED visits   77 year old male with prior history of stroke, on Coumadin for atrial fibrillation who presents with aphasia and right-sided weakness while drinking at a bar approximately 10:26 PM.  Differential diagnosis includes but is not limited to ICH, CVA, alcohol intoxication, metabolic, infectious etiologies, etc.  ED code stroke initiated upon patient's arrival to the treatment room.  Stat head CT will be obtained.  Will initiate screening lab work including INR.  Awaiting [facility] neurology consultation.  I have personally reviewed patient's records and do not see a pre-existing diagnosis for prior CVA.  Clinical Course as of Oct 30 126  Wed Oct 28, 2017  2355 Spoke with radiologist regarding CT scan results.  Family now at bedside.  INR is 2.6.  [facility] neurology evaluating patient currently.   [JS]  Thu 2017/11/08  0000 Spoke with [facility] neurologist Dr. Sheria Lang who has ordered CTA and perfusion scans.  Disposition (inpatient admission versus transfer to [facility]) pending results of CTA and perfusion scans.   [JS]  0104 [facility] neurologist spoke with [facility] neurology.  Will effect  transfer to [facility].  Spoke with Dr. Wilford Corner via telephone who accepts patient in transfer.  Patient in same condition.  Wife and daughter-in-law at bedside who are updated of plan and agreeable to transfer.   [JS]    Clinical Course User Index [JS] Irean Hong, MD     ____________________________________________   FINAL CLINICAL IMPRESSION(S) / ED DIAGNOSES  Final diagnoses:  Cerebrovascular accident (CVA), unspecified mechanism [facility])     ED Discharge Orders    None       Note:  This document was prepared using Dragon voice recognition software and may include unintentional dictation errors.    Irean Hong, MD 11/08/2017 [phone]

## 2017-10-28 NOTE — ED Notes (Signed)
Neuro nurse called at [phone]

## 2017-10-29 ENCOUNTER — Emergency Department: Payer: PPO

## 2017-10-29 ENCOUNTER — Encounter (HOSPITAL_COMMUNITY): Admission: RE | Disposition: E | Payer: Self-pay | Source: Other Acute Inpatient Hospital | Attending: Neurology

## 2017-10-29 ENCOUNTER — Inpatient Hospital Stay (HOSPITAL_COMMUNITY): Payer: PPO | Admitting: Anesthesiology

## 2017-10-29 ENCOUNTER — Inpatient Hospital Stay (HOSPITAL_COMMUNITY): Payer: PPO | Admitting: Certified Registered"

## 2017-10-29 ENCOUNTER — Inpatient Hospital Stay (HOSPITAL_COMMUNITY): Payer: PPO

## 2017-10-29 ENCOUNTER — Encounter: Payer: Self-pay | Admitting: Radiology

## 2017-10-29 ENCOUNTER — Inpatient Hospital Stay (HOSPITAL_COMMUNITY)
Admission: RE | Admit: 2017-10-29 | Discharge: 2017-11-29 | DRG: 023 | Disposition: E | Payer: PPO | Source: Other Acute Inpatient Hospital | Attending: Neurology | Admitting: Neurology

## 2017-10-29 DIAGNOSIS — Z7901 Long term (current) use of anticoagulants: Secondary | ICD-10-CM

## 2017-10-29 DIAGNOSIS — N179 Acute kidney failure, unspecified: Secondary | ICD-10-CM | POA: Diagnosis not present

## 2017-10-29 DIAGNOSIS — I6602 Occlusion and stenosis of left middle cerebral artery: Secondary | ICD-10-CM | POA: Diagnosis not present

## 2017-10-29 DIAGNOSIS — J9600 Acute respiratory failure, unspecified whether with hypoxia or hypercapnia: Secondary | ICD-10-CM | POA: Diagnosis not present

## 2017-10-29 DIAGNOSIS — I4891 Unspecified atrial fibrillation: Secondary | ICD-10-CM | POA: Diagnosis not present

## 2017-10-29 DIAGNOSIS — I6389 Other cerebral infarction: Secondary | ICD-10-CM | POA: Diagnosis not present

## 2017-10-29 DIAGNOSIS — I482 Chronic atrial fibrillation, unspecified: Secondary | ICD-10-CM

## 2017-10-29 DIAGNOSIS — I63232 Cerebral infarction due to unspecified occlusion or stenosis of left carotid arteries: Secondary | ICD-10-CM

## 2017-10-29 DIAGNOSIS — F17220 Nicotine dependence, chewing tobacco, uncomplicated: Secondary | ICD-10-CM | POA: Diagnosis present

## 2017-10-29 DIAGNOSIS — Z86718 Personal history of other venous thrombosis and embolism: Secondary | ICD-10-CM

## 2017-10-29 DIAGNOSIS — I639 Cerebral infarction, unspecified: Secondary | ICD-10-CM | POA: Diagnosis not present

## 2017-10-29 DIAGNOSIS — I110 Hypertensive heart disease with heart failure: Secondary | ICD-10-CM | POA: Diagnosis present

## 2017-10-29 DIAGNOSIS — Z66 Do not resuscitate: Secondary | ICD-10-CM | POA: Diagnosis not present

## 2017-10-29 DIAGNOSIS — I63412 Cerebral infarction due to embolism of left middle cerebral artery: Secondary | ICD-10-CM | POA: Diagnosis not present

## 2017-10-29 DIAGNOSIS — R791 Abnormal coagulation profile: Secondary | ICD-10-CM | POA: Diagnosis present

## 2017-10-29 DIAGNOSIS — I429 Cardiomyopathy, unspecified: Secondary | ICD-10-CM | POA: Diagnosis present

## 2017-10-29 DIAGNOSIS — I361 Nonrheumatic tricuspid (valve) insufficiency: Secondary | ICD-10-CM | POA: Diagnosis not present

## 2017-10-29 DIAGNOSIS — Z9889 Other specified postprocedural states: Secondary | ICD-10-CM

## 2017-10-29 DIAGNOSIS — G8191 Hemiplegia, unspecified affecting right dominant side: Secondary | ICD-10-CM | POA: Diagnosis present

## 2017-10-29 DIAGNOSIS — J9811 Atelectasis: Secondary | ICD-10-CM | POA: Diagnosis not present

## 2017-10-29 DIAGNOSIS — I63132 Cerebral infarction due to embolism of left carotid artery: Principal | ICD-10-CM | POA: Diagnosis present

## 2017-10-29 DIAGNOSIS — H53461 Homonymous bilateral field defects, right side: Secondary | ICD-10-CM | POA: Diagnosis present

## 2017-10-29 DIAGNOSIS — I272 Pulmonary hypertension, unspecified: Secondary | ICD-10-CM | POA: Diagnosis present

## 2017-10-29 DIAGNOSIS — R001 Bradycardia, unspecified: Secondary | ICD-10-CM | POA: Diagnosis not present

## 2017-10-29 DIAGNOSIS — J9601 Acute respiratory failure with hypoxia: Secondary | ICD-10-CM | POA: Diagnosis not present

## 2017-10-29 DIAGNOSIS — R4701 Aphasia: Secondary | ICD-10-CM | POA: Diagnosis not present

## 2017-10-29 DIAGNOSIS — Z978 Presence of other specified devices: Secondary | ICD-10-CM

## 2017-10-29 DIAGNOSIS — G9349 Other encephalopathy: Secondary | ICD-10-CM | POA: Diagnosis present

## 2017-10-29 DIAGNOSIS — R29730 NIHSS score 30: Secondary | ICD-10-CM | POA: Diagnosis not present

## 2017-10-29 DIAGNOSIS — I6522 Occlusion and stenosis of left carotid artery: Secondary | ICD-10-CM | POA: Diagnosis not present

## 2017-10-29 DIAGNOSIS — I63512 Cerebral infarction due to unspecified occlusion or stenosis of left middle cerebral artery: Secondary | ICD-10-CM | POA: Diagnosis not present

## 2017-10-29 DIAGNOSIS — I2510 Atherosclerotic heart disease of native coronary artery without angina pectoris: Secondary | ICD-10-CM | POA: Diagnosis not present

## 2017-10-29 DIAGNOSIS — D509 Iron deficiency anemia, unspecified: Secondary | ICD-10-CM | POA: Diagnosis present

## 2017-10-29 DIAGNOSIS — E785 Hyperlipidemia, unspecified: Secondary | ICD-10-CM

## 2017-10-29 DIAGNOSIS — I6612 Occlusion and stenosis of left anterior cerebral artery: Secondary | ICD-10-CM | POA: Diagnosis present

## 2017-10-29 DIAGNOSIS — R918 Other nonspecific abnormal finding of lung field: Secondary | ICD-10-CM | POA: Diagnosis not present

## 2017-10-29 DIAGNOSIS — I70201 Unspecified atherosclerosis of native arteries of extremities, right leg: Secondary | ICD-10-CM | POA: Diagnosis not present

## 2017-10-29 DIAGNOSIS — E876 Hypokalemia: Secondary | ICD-10-CM | POA: Diagnosis not present

## 2017-10-29 DIAGNOSIS — I959 Hypotension, unspecified: Secondary | ICD-10-CM | POA: Diagnosis not present

## 2017-10-29 DIAGNOSIS — J9690 Respiratory failure, unspecified, unspecified whether with hypoxia or hypercapnia: Secondary | ICD-10-CM

## 2017-10-29 DIAGNOSIS — R0602 Shortness of breath: Secondary | ICD-10-CM

## 2017-10-29 DIAGNOSIS — R1312 Dysphagia, oropharyngeal phase: Secondary | ICD-10-CM | POA: Diagnosis not present

## 2017-10-29 DIAGNOSIS — I340 Nonrheumatic mitral (valve) insufficiency: Secondary | ICD-10-CM

## 2017-10-29 DIAGNOSIS — J449 Chronic obstructive pulmonary disease, unspecified: Secondary | ICD-10-CM | POA: Diagnosis not present

## 2017-10-29 DIAGNOSIS — G935 Compression of brain: Secondary | ICD-10-CM | POA: Diagnosis not present

## 2017-10-29 DIAGNOSIS — G936 Cerebral edema: Secondary | ICD-10-CM | POA: Diagnosis not present

## 2017-10-29 DIAGNOSIS — R471 Dysarthria and anarthria: Secondary | ICD-10-CM | POA: Diagnosis present

## 2017-10-29 DIAGNOSIS — J439 Emphysema, unspecified: Secondary | ICD-10-CM | POA: Diagnosis present

## 2017-10-29 DIAGNOSIS — R1310 Dysphagia, unspecified: Secondary | ICD-10-CM | POA: Diagnosis present

## 2017-10-29 DIAGNOSIS — I5022 Chronic systolic (congestive) heart failure: Secondary | ICD-10-CM | POA: Diagnosis present

## 2017-10-29 DIAGNOSIS — Z01818 Encounter for other preprocedural examination: Secondary | ICD-10-CM | POA: Diagnosis not present

## 2017-10-29 DIAGNOSIS — I743 Embolism and thrombosis of arteries of the lower extremities: Secondary | ICD-10-CM | POA: Diagnosis not present

## 2017-10-29 DIAGNOSIS — Z515 Encounter for palliative care: Secondary | ICD-10-CM | POA: Diagnosis not present

## 2017-10-29 DIAGNOSIS — R29818 Other symptoms and signs involving the nervous system: Secondary | ICD-10-CM | POA: Diagnosis not present

## 2017-10-29 DIAGNOSIS — D62 Acute posthemorrhagic anemia: Secondary | ICD-10-CM | POA: Diagnosis not present

## 2017-10-29 DIAGNOSIS — R29726 NIHSS score 26: Secondary | ICD-10-CM | POA: Diagnosis present

## 2017-10-29 DIAGNOSIS — I998 Other disorder of circulatory system: Secondary | ICD-10-CM | POA: Diagnosis not present

## 2017-10-29 DIAGNOSIS — Z8619 Personal history of other infectious and parasitic diseases: Secondary | ICD-10-CM

## 2017-10-29 DIAGNOSIS — Z7189 Other specified counseling: Secondary | ICD-10-CM | POA: Diagnosis not present

## 2017-10-29 DIAGNOSIS — I509 Heart failure, unspecified: Secondary | ICD-10-CM | POA: Diagnosis not present

## 2017-10-29 DIAGNOSIS — I679 Cerebrovascular disease, unspecified: Secondary | ICD-10-CM | POA: Diagnosis not present

## 2017-10-29 HISTORY — PX: PRIMARY CLOSURE: SHX6224

## 2017-10-29 HISTORY — PX: IR ANGIOGRAM EXTREMITY LEFT: IMG651

## 2017-10-29 HISTORY — PX: ENDARTERECTOMY FEMORAL: SHX5804

## 2017-10-29 HISTORY — PX: APPLICATION OF WOUND VAC: SHX5189

## 2017-10-29 HISTORY — PX: IR CT HEAD LTD: IMG2386

## 2017-10-29 HISTORY — PX: RADIOLOGY WITH ANESTHESIA: SHX6223

## 2017-10-29 HISTORY — PX: FASCIOTOMY: SHX132

## 2017-10-29 HISTORY — PX: VEIN HARVEST: SHX6363

## 2017-10-29 HISTORY — PX: PATCH ANGIOPLASTY: SHX6230

## 2017-10-29 HISTORY — PX: IR PERCUTANEOUS ART THROMBECTOMY/INFUSION INTRACRANIAL INC DIAG ANGIO: IMG6087

## 2017-10-29 HISTORY — PX: EMBOLECTOMY: SHX44

## 2017-10-29 LAB — HEMOGLOBIN A1C
Hgb A1c MFr Bld: 5.8 % — ABNORMAL HIGH (ref 4.8–5.6)
MEAN PLASMA GLUCOSE: 119.76 mg/dL

## 2017-10-29 LAB — CBC
HEMATOCRIT: 24.4 % — AB (ref 39.0–52.0)
HEMOGLOBIN: 8.2 g/dL — AB (ref 13.0–17.0)
MCH: 30.9 pg (ref 26.0–34.0)
MCHC: 33.6 g/dL (ref 30.0–36.0)
MCV: 92.1 fL (ref 78.0–100.0)
Platelets: 185 10*3/uL (ref 150–400)
RBC: 2.65 MIL/uL — ABNORMAL LOW (ref 4.22–5.81)
RDW: 13.2 % (ref 11.5–15.5)
WBC: 9 10*3/uL (ref 4.0–10.5)

## 2017-10-29 LAB — POCT I-STAT 7, (LYTES, BLD GAS, ICA,H+H)
ACID-BASE DEFICIT: 3 mmol/L — AB (ref 0.0–2.0)
BICARBONATE: 22.8 mmol/L (ref 20.0–28.0)
Calcium, Ion: 1.1 mmol/L — ABNORMAL LOW (ref 1.15–1.40)
HEMATOCRIT: 31 % — AB (ref 39.0–52.0)
Hemoglobin: 10.5 g/dL — ABNORMAL LOW (ref 13.0–17.0)
O2 Saturation: 100 %
PH ART: 7.36 (ref 7.350–7.450)
POTASSIUM: 4.2 mmol/L (ref 3.5–5.1)
Patient temperature: 34.9
SODIUM: 138 mmol/L (ref 135–145)
TCO2: 24 mmol/L (ref 22–32)
pCO2 arterial: 39.5 mmHg (ref 32.0–48.0)
pO2, Arterial: 406 mmHg — ABNORMAL HIGH (ref 83.0–108.0)

## 2017-10-29 LAB — LIPID PANEL
CHOL/HDL RATIO: 3.2 ratio
CHOLESTEROL: 117 mg/dL (ref 0–200)
HDL: 37 mg/dL — AB (ref >40)
LDL Cholesterol: 67 mg/dL (ref 0–99)
TRIGLYCERIDES: 66 mg/dL (ref <150)
VLDL: 13 mg/dL (ref 0–40)

## 2017-10-29 LAB — POCT I-STAT 3, ART BLOOD GAS (G3+)
Acid-base deficit: 2 mmol/L (ref 0.0–2.0)
BICARBONATE: 23.9 mmol/L (ref 20.0–28.0)
O2 Saturation: 100 %
PH ART: 7.383 (ref 7.350–7.450)
PO2 ART: 425 mmHg — AB (ref 83.0–108.0)
TCO2: 25 mmol/L (ref 22–32)
pCO2 arterial: 39.2 mmHg (ref 32.0–48.0)

## 2017-10-29 LAB — POCT ACTIVATED CLOTTING TIME: Activated Clotting Time: 158 seconds

## 2017-10-29 LAB — HEMOGLOBIN AND HEMATOCRIT, BLOOD
HCT: 26.5 % — ABNORMAL LOW (ref 39.0–52.0)
HEMOGLOBIN: 8.9 g/dL — AB (ref 13.0–17.0)

## 2017-10-29 LAB — ECHOCARDIOGRAM COMPLETE
Height: 66 in
Weight: 2469.15 oz

## 2017-10-29 LAB — TYPE AND SCREEN
ABO/RH(D): O POS
ANTIBODY SCREEN: NEGATIVE

## 2017-10-29 LAB — RAPID URINE DRUG SCREEN, HOSP PERFORMED
Amphetamines: NOT DETECTED
BARBITURATES: NOT DETECTED
Benzodiazepines: NOT DETECTED
Cocaine: NOT DETECTED
Opiates: NOT DETECTED
TETRAHYDROCANNABINOL: NOT DETECTED

## 2017-10-29 LAB — SURGICAL PCR SCREEN
MRSA, PCR: NEGATIVE
Staphylococcus aureus: NEGATIVE

## 2017-10-29 LAB — ABO/RH: ABO/RH(D): O POS

## 2017-10-29 LAB — TRIGLYCERIDES: Triglycerides: 70 mg/dL (ref <150)

## 2017-10-29 SURGERY — EMBOLECTOMY
Anesthesia: General | Site: Leg Lower | Laterality: Right

## 2017-10-29 SURGERY — IR WITH ANESTHESIA
Anesthesia: General

## 2017-10-29 MED ORDER — LACTATED RINGERS IV SOLN
INTRAVENOUS | Status: DC | PRN
  Administered 2017-10-29 (×2): via INTRAVENOUS

## 2017-10-29 MED ORDER — FENTANYL CITRATE (PF) 100 MCG/2ML IJ SOLN
INTRAMUSCULAR | Status: AC
  Filled 2017-10-29: qty 2

## 2017-10-29 MED ORDER — ASPIRIN 325 MG PO TABS
ORAL_TABLET | ORAL | Status: AC
  Filled 2017-10-29: qty 1

## 2017-10-29 MED ORDER — 0.9 % SODIUM CHLORIDE (POUR BTL) OPTIME
TOPICAL | Status: DC | PRN
  Administered 2017-10-29: 1000 mL

## 2017-10-29 MED ORDER — SIMVASTATIN 40 MG PO TABS
40.0000 mg | ORAL_TABLET | Freq: Every day | ORAL | Status: DC
  Administered 2017-10-30 – 2017-10-31 (×2): 40 mg
  Filled 2017-10-29 (×3): qty 1

## 2017-10-29 MED ORDER — SODIUM CHLORIDE 0.9% IV SOLUTION
Freq: Once | INTRAVENOUS | Status: AC
  Administered 2017-10-29: 12:00:00 via INTRAVENOUS

## 2017-10-29 MED ORDER — THIAMINE HCL 100 MG/ML IJ SOLN
100.0000 mg | Freq: Every day | INTRAMUSCULAR | Status: DC
  Administered 2017-10-29 – 2017-11-01 (×4): 100 mg via INTRAVENOUS
  Filled 2017-10-29 (×4): qty 2

## 2017-10-29 MED ORDER — IOHEXOL 300 MG/ML  SOLN
150.0000 mL | Freq: Once | INTRAMUSCULAR | Status: AC | PRN
  Administered 2017-10-29: 70 mL via INTRA_ARTERIAL

## 2017-10-29 MED ORDER — FENTANYL CITRATE (PF) 100 MCG/2ML IJ SOLN
50.0000 ug | INTRAMUSCULAR | Status: DC | PRN
  Administered 2017-10-31 – 2017-11-01 (×2): 50 ug via INTRAVENOUS
  Filled 2017-10-29 (×2): qty 2

## 2017-10-29 MED ORDER — HEPARIN SODIUM (PORCINE) 1000 UNIT/ML IJ SOLN
INTRAMUSCULAR | Status: AC
  Filled 2017-10-29: qty 1

## 2017-10-29 MED ORDER — CLOPIDOGREL BISULFATE 300 MG PO TABS
ORAL_TABLET | ORAL | Status: AC
  Filled 2017-10-29: qty 1

## 2017-10-29 MED ORDER — HEPARIN SODIUM (PORCINE) 1000 UNIT/ML IJ SOLN
INTRAMUSCULAR | Status: DC | PRN
  Administered 2017-10-29: 7000 [IU] via INTRAVENOUS

## 2017-10-29 MED ORDER — MIDAZOLAM HCL 5 MG/5ML IJ SOLN
INTRAMUSCULAR | Status: DC | PRN
  Administered 2017-10-29: 2 mg via INTRAVENOUS

## 2017-10-29 MED ORDER — TIROFIBAN HCL IN NACL 5-0.9 MG/100ML-% IV SOLN
INTRAVENOUS | Status: AC
  Filled 2017-10-29: qty 100

## 2017-10-29 MED ORDER — FENTANYL CITRATE (PF) 100 MCG/2ML IJ SOLN
50.0000 ug | INTRAMUSCULAR | Status: DC | PRN
  Administered 2017-10-29 – 2017-10-30 (×5): 50 ug via INTRAVENOUS
  Filled 2017-10-29 (×5): qty 2

## 2017-10-29 MED ORDER — ACETAMINOPHEN 160 MG/5ML PO SOLN
650.0000 mg | ORAL | Status: DC | PRN
  Filled 2017-10-29: qty 20.3

## 2017-10-29 MED ORDER — PROTAMINE SULFATE 10 MG/ML IV SOLN
INTRAVENOUS | Status: DC | PRN
  Administered 2017-10-29 (×5): 10 mg via INTRAVENOUS

## 2017-10-29 MED ORDER — ORAL CARE MOUTH RINSE
15.0000 mL | OROMUCOSAL | Status: DC
  Administered 2017-10-29 – 2017-11-02 (×38): 15 mL via OROMUCOSAL

## 2017-10-29 MED ORDER — PHENYLEPHRINE 40 MCG/ML (10ML) SYRINGE FOR IV PUSH (FOR BLOOD PRESSURE SUPPORT)
PREFILLED_SYRINGE | INTRAVENOUS | Status: DC | PRN
  Administered 2017-10-29 (×2): 80 ug via INTRAVENOUS

## 2017-10-29 MED ORDER — NITROGLYCERIN 1 MG/10 ML FOR IR/CATH LAB
INTRA_ARTERIAL | Status: DC | PRN
  Administered 2017-10-29 (×4): 25 ug via INTRA_ARTERIAL

## 2017-10-29 MED ORDER — CEFAZOLIN SODIUM 1 G IJ SOLR
INTRAMUSCULAR | Status: AC
Start: 2017-10-29 — End: ?
  Filled 2017-10-29: qty 20

## 2017-10-29 MED ORDER — CLEVIDIPINE BUTYRATE 0.5 MG/ML IV EMUL
0.0000 mg/h | INTRAVENOUS | Status: DC
  Administered 2017-10-29: 1 mg/h via INTRAVENOUS
  Filled 2017-10-29 (×2): qty 50

## 2017-10-29 MED ORDER — ROCURONIUM BROMIDE 10 MG/ML (PF) SYRINGE
PREFILLED_SYRINGE | INTRAVENOUS | Status: DC | PRN
  Administered 2017-10-29 (×2): 50 mg via INTRAVENOUS

## 2017-10-29 MED ORDER — PROPOFOL 1000 MG/100ML IV EMUL
0.0000 ug/kg/min | INTRAVENOUS | Status: DC
  Administered 2017-10-29: 15 ug/kg/min via INTRAVENOUS
  Administered 2017-10-29: 20 ug/kg/min via INTRAVENOUS
  Administered 2017-10-29: 35 ug/kg/min via INTRAVENOUS
  Administered 2017-10-30: 30 ug/kg/min via INTRAVENOUS
  Administered 2017-10-30 – 2017-10-31 (×2): 40 ug/kg/min via INTRAVENOUS
  Administered 2017-10-31 (×2): 50 ug/kg/min via INTRAVENOUS
  Administered 2017-10-31 (×2): 40 ug/kg/min via INTRAVENOUS
  Administered 2017-11-01 – 2017-11-02 (×6): 50 ug/kg/min via INTRAVENOUS
  Filled 2017-10-29 (×18): qty 100

## 2017-10-29 MED ORDER — ACETAMINOPHEN 160 MG/5ML PO SOLN
650.0000 mg | ORAL | Status: DC | PRN
  Administered 2017-10-30: 650 mg
  Filled 2017-10-29: qty 20.3

## 2017-10-29 MED ORDER — STROKE: EARLY STAGES OF RECOVERY BOOK
Freq: Once | Status: DC
  Filled 2017-10-29: qty 1

## 2017-10-29 MED ORDER — SODIUM CHLORIDE 0.9 % IV SOLN
INTRAVENOUS | Status: DC | PRN
  Administered 2017-10-29: 50 ug/min via INTRAVENOUS

## 2017-10-29 MED ORDER — SODIUM CHLORIDE 0.9 % IV SOLN
INTRAVENOUS | Status: DC | PRN
  Administered 2017-10-29: 09:00:00

## 2017-10-29 MED ORDER — ACETAMINOPHEN 650 MG RE SUPP
650.0000 mg | RECTAL | Status: DC | PRN
  Filled 2017-10-29: qty 1

## 2017-10-29 MED ORDER — IOHEXOL 350 MG/ML SOLN
100.0000 mL | Freq: Once | INTRAVENOUS | Status: AC | PRN
  Administered 2017-10-29: 100 mL via INTRAVENOUS

## 2017-10-29 MED ORDER — LACTATED RINGERS IV SOLN
INTRAVENOUS | Status: DC | PRN
  Administered 2017-10-29: 07:00:00 via INTRAVENOUS

## 2017-10-29 MED ORDER — ALBUMIN HUMAN 5 % IV SOLN
INTRAVENOUS | Status: DC | PRN
  Administered 2017-10-29: 10:00:00 via INTRAVENOUS

## 2017-10-29 MED ORDER — FAMOTIDINE IN NACL 20-0.9 MG/50ML-% IV SOLN
20.0000 mg | Freq: Two times a day (BID) | INTRAVENOUS | Status: DC
  Administered 2017-10-29 – 2017-11-01 (×8): 20 mg via INTRAVENOUS
  Filled 2017-10-29 (×8): qty 50

## 2017-10-29 MED ORDER — SODIUM CHLORIDE 0.9% IV SOLUTION: Freq: Once | INTRAVENOUS | Status: DC

## 2017-10-29 MED ORDER — POTASSIUM CHLORIDE 20 MEQ/15ML (10%) PO SOLN: 40.0000 meq | Freq: Once | ORAL | Status: DC

## 2017-10-29 MED ORDER — ACETAMINOPHEN 650 MG RE SUPP: 650.0000 mg | RECTAL | Status: DC | PRN

## 2017-10-29 MED ORDER — CEFAZOLIN SODIUM-DEXTROSE 2-3 GM-%(50ML) IV SOLR
INTRAVENOUS | Status: DC | PRN
  Administered 2017-10-29: 2 g via INTRAVENOUS

## 2017-10-29 MED ORDER — SODIUM CHLORIDE 0.9 % IV SOLN
INTRAVENOUS | Status: AC
  Filled 2017-10-29: qty 1.2

## 2017-10-29 MED ORDER — FUROSEMIDE 10 MG/ML IJ SOLN: 40.0000 mg | Freq: Once | INTRAMUSCULAR | Status: DC

## 2017-10-29 MED ORDER — PHENYLEPHRINE HCL-NACL 10-0.9 MG/250ML-% IV SOLN
0.0000 ug/min | INTRAVENOUS | Status: DC
  Administered 2017-10-29: 20 ug/min via INTRAVENOUS
  Administered 2017-10-29 (×2): 50 ug/min via INTRAVENOUS
  Administered 2017-10-29 – 2017-10-30 (×6): 40 ug/min via INTRAVENOUS
  Administered 2017-10-31: 20 ug/min via INTRAVENOUS
  Filled 2017-10-29 (×11): qty 250

## 2017-10-29 MED ORDER — FENTANYL CITRATE (PF) 100 MCG/2ML IJ SOLN
INTRAMUSCULAR | Status: DC | PRN
  Administered 2017-10-29: 100 ug via INTRAVENOUS

## 2017-10-29 MED ORDER — CEFAZOLIN SODIUM-DEXTROSE 2-4 GM/100ML-% IV SOLN
INTRAVENOUS | Status: AC
  Filled 2017-10-29: qty 100

## 2017-10-29 MED ORDER — SODIUM CHLORIDE 0.9 % IV SOLN
INTRAVENOUS | Status: DC | PRN
  Administered 2017-10-29: 10 ug/min via INTRAVENOUS

## 2017-10-29 MED ORDER — NITROGLYCERIN 1 MG/10 ML FOR IR/CATH LAB
INTRA_ARTERIAL | Status: AC
  Filled 2017-10-29: qty 10

## 2017-10-29 MED ORDER — SUCCINYLCHOLINE 20MG/ML (10ML) SYRINGE FOR MEDFUSION PUMP - OPTIME
INTRAMUSCULAR | Status: DC | PRN
  Administered 2017-10-29: 120 mg via INTRAVENOUS

## 2017-10-29 MED ORDER — HEMOSTATIC AGENTS (NO CHARGE) OPTIME
TOPICAL | Status: DC | PRN
  Administered 2017-10-29: 1 via TOPICAL

## 2017-10-29 MED ORDER — ACETAMINOPHEN 325 MG PO TABS: 650.0000 mg | ORAL_TABLET | ORAL | Status: DC | PRN

## 2017-10-29 MED ORDER — SODIUM CHLORIDE 0.9 % IV SOLN
INTRAVENOUS | Status: DC
  Administered 2017-10-29 – 2017-10-31 (×3): via INTRAVENOUS

## 2017-10-29 MED ORDER — ACETAMINOPHEN 325 MG PO TABS
650.0000 mg | ORAL_TABLET | ORAL | Status: DC | PRN
  Filled 2017-10-29: qty 2

## 2017-10-29 MED ORDER — IOHEXOL 300 MG/ML  SOLN
150.0000 mL | Freq: Once | INTRAMUSCULAR | Status: AC | PRN
  Administered 2017-10-29: 80 mL via INTRA_ARTERIAL

## 2017-10-29 MED ORDER — FAMOTIDINE IN NACL 20-0.9 MG/50ML-% IV SOLN: 20.0000 mg | Freq: Two times a day (BID) | INTRAVENOUS | Status: DC

## 2017-10-29 MED ORDER — EPTIFIBATIDE 20 MG/10ML IV SOLN
INTRAVENOUS | Status: AC
  Filled 2017-10-29: qty 10

## 2017-10-29 MED ORDER — THIAMINE HCL 100 MG/ML IJ SOLN: 100.0000 mg | Freq: Every day | INTRAMUSCULAR | Status: DC

## 2017-10-29 MED ORDER — FOLIC ACID 5 MG/ML IJ SOLN
1.0000 mg | Freq: Every day | INTRAMUSCULAR | Status: DC
  Administered 2017-10-29 – 2017-11-01 (×3): 1 mg via INTRAVENOUS
  Filled 2017-10-29 (×6): qty 0.2

## 2017-10-29 MED ORDER — SODIUM CHLORIDE 0.9 % IV SOLN: INTRAVENOUS | Status: DC

## 2017-10-29 MED ORDER — FENTANYL CITRATE (PF) 250 MCG/5ML IJ SOLN
INTRAMUSCULAR | Status: AC
  Filled 2017-10-29: qty 5

## 2017-10-29 MED ORDER — PHENYLEPHRINE 40 MCG/ML (10ML) SYRINGE FOR IV PUSH (FOR BLOOD PRESSURE SUPPORT)
PREFILLED_SYRINGE | INTRAVENOUS | Status: AC
  Filled 2017-10-29: qty 10

## 2017-10-29 MED ORDER — SENNOSIDES-DOCUSATE SODIUM 8.6-50 MG PO TABS
1.0000 | ORAL_TABLET | Freq: Every evening | ORAL | Status: DC | PRN
  Filled 2017-10-29: qty 1

## 2017-10-29 MED ORDER — ROCURONIUM 10MG/ML (10ML) SYRINGE FOR MEDFUSION PUMP - OPTIME
INTRAVENOUS | Status: DC | PRN
  Administered 2017-10-29 (×2): 50 mg via INTRAVENOUS

## 2017-10-29 MED ORDER — LIDOCAINE HCL (CARDIAC) PF 100 MG/5ML IV SOSY
PREFILLED_SYRINGE | INTRAVENOUS | Status: DC | PRN
  Administered 2017-10-29: 100 mg via INTRATRACHEAL

## 2017-10-29 MED ORDER — LIDOCAINE HCL 1 % IJ SOLN
INTRAMUSCULAR | Status: AC
  Filled 2017-10-29: qty 20

## 2017-10-29 MED ORDER — FENTANYL CITRATE (PF) 250 MCG/5ML IJ SOLN
INTRAMUSCULAR | Status: DC | PRN
  Administered 2017-10-29 (×2): 50 ug via INTRAVENOUS
  Administered 2017-10-29: 100 ug via INTRAVENOUS
  Administered 2017-10-29: 50 ug via INTRAVENOUS

## 2017-10-29 MED ORDER — FOLIC ACID 5 MG/ML IJ SOLN
1.0000 mg | Freq: Every day | INTRAMUSCULAR | Status: DC
  Filled 2017-10-29: qty 0.2

## 2017-10-29 MED ORDER — MIDAZOLAM HCL 2 MG/2ML IJ SOLN
INTRAMUSCULAR | Status: AC
  Filled 2017-10-29: qty 2

## 2017-10-29 MED ORDER — CLEVIDIPINE BUTYRATE 0.5 MG/ML IV EMUL: 0.0000 mg/h | INTRAVENOUS | Status: DC

## 2017-10-29 MED ORDER — CHLORHEXIDINE GLUCONATE 0.12% ORAL RINSE (MEDLINE KIT)
15.0000 mL | Freq: Two times a day (BID) | OROMUCOSAL | Status: DC
  Administered 2017-10-29 – 2017-11-02 (×8): 15 mL via OROMUCOSAL

## 2017-10-29 MED ORDER — PROPOFOL 10 MG/ML IV BOLUS
INTRAVENOUS | Status: DC | PRN
  Administered 2017-10-29: 100 mg via INTRAVENOUS

## 2017-10-29 MED ORDER — LACTATED RINGERS IV SOLN
INTRAVENOUS | Status: DC | PRN
  Administered 2017-10-29 (×2): via INTRAVENOUS

## 2017-10-29 MED ORDER — PROTAMINE SULFATE 10 MG/ML IV SOLN
INTRAVENOUS | Status: AC
  Filled 2017-10-29: qty 5

## 2017-10-29 MED ORDER — TICAGRELOR 90 MG PO TABS
ORAL_TABLET | ORAL | Status: AC
  Filled 2017-10-29: qty 2

## 2017-10-29 MED ORDER — PROPOFOL 10 MG/ML IV BOLUS
INTRAVENOUS | Status: AC
  Filled 2017-10-29: qty 20

## 2017-10-29 SURGICAL SUPPLY — 56 items
CANISTER SUCT 3000ML PPV (MISCELLANEOUS) ×5 IMPLANT
CATH EMB 3FR 80CM (CATHETERS) ×5 IMPLANT
CATH EMB 4FR 80CM (CATHETERS) IMPLANT
CATH EMB 5FR 80CM (CATHETERS) IMPLANT
CLIP VESOCCLUDE MED 24/CT (CLIP) ×10 IMPLANT
CLIP VESOCCLUDE SM WIDE 24/CT (CLIP) ×10 IMPLANT
CONNECTOR Y ATS VAC SYSTEM (MISCELLANEOUS) ×5 IMPLANT
COVER BACK TABLE 60X90IN (DRAPES) ×5 IMPLANT
DERMABOND ADHESIVE PROPEN (GAUZE/BANDAGES/DRESSINGS) ×2
DERMABOND ADVANCED (GAUZE/BANDAGES/DRESSINGS) ×2
DERMABOND ADVANCED .7 DNX12 (GAUZE/BANDAGES/DRESSINGS) ×3 IMPLANT
DERMABOND ADVANCED .7 DNX6 (GAUZE/BANDAGES/DRESSINGS) ×3 IMPLANT
DEVICE CLOSURE PERCLS PRGLD 6F (VASCULAR PRODUCTS) ×3 IMPLANT
DRAIN CHANNEL 15F RND FF W/TCR (WOUND CARE) IMPLANT
DRAPE X-RAY CASS 24X20 (DRAPES) IMPLANT
DRSG TEGADERM 2-3/8X2-3/4 SM (GAUZE/BANDAGES/DRESSINGS) ×5 IMPLANT
DRSG VAC ATS SM SENSATRAC (GAUZE/BANDAGES/DRESSINGS) ×5 IMPLANT
ELECT REM PT RETURN 9FT ADLT (ELECTROSURGICAL) ×5
ELECTRODE REM PT RTRN 9FT ADLT (ELECTROSURGICAL) ×3 IMPLANT
EVACUATOR SILICONE 100CC (DRAIN) IMPLANT
GLOVE BIO SURGEON STRL SZ7.5 (GLOVE) ×5 IMPLANT
GOWN STRL REUS W/ TWL LRG LVL3 (GOWN DISPOSABLE) ×6 IMPLANT
GOWN STRL REUS W/ TWL XL LVL3 (GOWN DISPOSABLE) ×3 IMPLANT
GOWN STRL REUS W/TWL LRG LVL3 (GOWN DISPOSABLE) ×4
GOWN STRL REUS W/TWL XL LVL3 (GOWN DISPOSABLE) ×2
HEMOSTAT SNOW SURGICEL 2X4 (HEMOSTASIS) ×5 IMPLANT
KIT BASIN OR (CUSTOM PROCEDURE TRAY) ×5 IMPLANT
KIT TURNOVER KIT B (KITS) ×5 IMPLANT
NS IRRIG 1000ML POUR BTL (IV SOLUTION) ×10 IMPLANT
PACK PERIPHERAL VASCULAR (CUSTOM PROCEDURE TRAY) ×5 IMPLANT
PAD ARMBOARD 7.5X6 YLW CONV (MISCELLANEOUS) ×10 IMPLANT
PERCLOSE PROGLIDE 6F (VASCULAR PRODUCTS) ×5
SET COLLECT BLD 21X3/4 12 (NEEDLE) IMPLANT
SHEATH BRITE TIP 7FRX11 (SHEATH) ×5 IMPLANT
SPONGE INTESTINAL PEANUT (DISPOSABLE) ×5 IMPLANT
SPONGE LAP 18X18 X RAY DECT (DISPOSABLE) ×5 IMPLANT
STOPCOCK 4 WAY LG BORE MALE ST (IV SETS) IMPLANT
SUT ETHILON 3 0 PS 1 (SUTURE) IMPLANT
SUT MNCRL AB 4-0 PS2 18 (SUTURE) ×10 IMPLANT
SUT PROLENE 5 0 C 1 24 (SUTURE) ×15 IMPLANT
SUT PROLENE 6 0 BV (SUTURE) ×5 IMPLANT
SUT PROLENE 7 0 BV1 MDA (SUTURE) ×5 IMPLANT
SUT SILK 3 0 (SUTURE)
SUT SILK 3-0 18XBRD TIE 12 (SUTURE) IMPLANT
SUT VIC AB 2-0 CT1 27 (SUTURE) ×4
SUT VIC AB 2-0 CT1 TAPERPNT 27 (SUTURE) ×6 IMPLANT
SUT VIC AB 3-0 SH 27 (SUTURE) ×4
SUT VIC AB 3-0 SH 27X BRD (SUTURE) ×6 IMPLANT
SYR 3ML LL SCALE MARK (SYRINGE) ×5 IMPLANT
TOWEL GREEN STERILE (TOWEL DISPOSABLE) ×5 IMPLANT
TRAY FOLEY MTR SLVR 16FR STAT (SET/KITS/TRAYS/PACK) ×5 IMPLANT
TUBING EXTENTION W/L.L. (IV SETS) IMPLANT
UNDERPAD 30X30 (UNDERPADS AND DIAPERS) ×5 IMPLANT
WATER STERILE IRR 1000ML POUR (IV SOLUTION) ×5 IMPLANT
WIRE BENTSON .035X145CM (WIRE) ×5 IMPLANT
WND VAC CANISTER 500ML (MISCELLANEOUS) ×5 IMPLANT

## 2017-10-29 NOTE — Progress Notes (Addendum)
Initial Nutrition Assessment  DOCUMENTATION CODES:   Not applicable  INTERVENTION:   -If pt remains intubated over 24-48 hours, recommend:  Initiate TF with Vital AF 1.2 at goal rate of 60 ml/h (1440 ml per day) to provide 1728 kcals, 108 gm protein, 1168 ml free water daily.  Complete regimen with inclusion of propofol to provide 1950 kcals.   NUTRITION DIAGNOSIS:   Inadequate oral intake related to inability to eat as evidenced by NPO status.  GOAL:   Patient will meet greater than or equal to 90% of their needs  MONITOR:   Vent status, Labs, Weight trends, Skin, I & O's  REASON FOR ASSESSMENT:   Ventilator    ASSESSMENT:   77 y.o. male with hx AF on coumadin, presented to [facility] with stroke like symptoms.  Found to have evolving left MCA infarct.  [facility] and [facility] to IR where he had complete revascularization of occluded left ICA, MCA, ACA.  Patient is currently intubated on ventilator support.  MV: 12.8 L/min Temp (24hrs), Avg:96.5 F (35.8 C), Min:93.6 F (34.2 C), Max:100.2 F (37.9 C)  Propofol: 8.4 ml/hr (222 kcals)  8/1- s/p for right femoral embolectomy and fasciotomy  Pt agitated at time of visit. Nutrition-focused physical exam deferred secondary to agitation. No family present to provide further history.   Case discussed with RN, who confirms that pt just arrived to the unit for surgery. No to start TF today, however, RN suspects TF may be initiated tomorrow.   Labs reviewed.   NUTRITION - FOCUSED PHYSICAL EXAM:    Most Recent Value  Orbital Region  Unable to assess  Upper Arm Region  Unable to assess  Thoracic and Lumbar Region  Unable to assess  Buccal Region  Unable to assess  Temple Region  Unable to assess  Clavicle Bone Region  Unable to assess  Clavicle and Acromion Bone Region  Unable to assess  Scapular Bone Region  Unable to assess  Dorsal Hand  Unable to assess  Patellar Region  Unable to assess  Anterior Thigh Region   Unable to assess  Posterior Calf Region  Unable to assess  Edema (RD Assessment)  Unable to assess  Hair  Unable to assess  Eyes  Unable to assess  Mouth  Unable to assess  Skin  Unable to assess  Nails  Unable to assess       Diet Order:   Diet Order           Diet NPO time specified  Diet effective now          EDUCATION NEEDS:   Not appropriate for education at this time  Skin:  Skin Assessment: Skin Integrity Issues: Skin Integrity Issues:: Incisions, Wound VAC Wound Vac: rt groin Incisions: closed lt anterior groin, lt groin, rt groin Other: n/a  Last BM:  PTA  Height:   Ht Readings from Last 1 Encounters:  2018/03/17 5\' 6"  (1.676 m)    Weight:   Wt Readings from Last 1 Encounters:  2018/03/17 154 lb 5.2 oz (70 kg)    Ideal Body Weight:  64.5 kg  BMI:  Body mass index is 24.91 kg/m.  Estimated Nutritional Needs:   Kcal:  1842  Protein:  100-115 grams  Fluid:  > 1.8 L    Camie Hauss A. Mayford KnifeWilliams, RD, LDN, CDE Pager: [phone] After hours Pager: [phone]

## 2017-10-29 NOTE — Consult Note (Addendum)
Referring Physician: Dr Estanislado Pandy  Patient name: Hunter Adkins MRN: [id number] DOB: 1940-07-01 Sex: male  REASON FOR CONSULT: right leg ischemia  HPI: Hunter Adkins is a 77 y.o. male left brain stroke yesterday presented to [place] around 11 pm.  He was transferred to [facility] for neurointervention.  He was noted to have no pulse in right foot pre procedure.  One attempt was made at right femoral cannulation then was switched to left side.  He did have flow to left foot pre procedure.  He is currently intubated and on propofol.  According to nursing staff he is moving left side some but not right.  Per chart he is on chronic coumadin for afib and arrived with INR 2.5.  He has a history of cardiomyopathy and CHF but no old ECHO available for review.  He also has a history of alcohol abuse, COPD, and DVT.   Past Medical History:  Diagnosis Date  . Anemia   . Cardiomyopathy ([place])   . CHF (congestive heart failure) ([place])   . COPD (chronic obstructive pulmonary disease) ([place])   . Dysrhythmia    Atrial Fibrillation  . ETOH abuse   . GERD (gastroesophageal reflux disease)   . History of DVT of lower extremity   . History of Helicobacter pylori infection    Past Surgical History:  Procedure Laterality Date  . CARDIAC CATHETERIZATION    . COLONOSCOPY W/ POLYPECTOMY    . COLONOSCOPY WITH PROPOFOL N/A 10/14/2017   Procedure: COLONOSCOPY WITH PROPOFOL;  Surgeon: Manya Silvas, MD;  Location: [facility] ENDOSCOPY;  Service: Endoscopy;  Laterality: N/A;  . ESOPHAGOGASTRODUODENOSCOPY (EGD) WITH PROPOFOL N/A 10/14/2017   Procedure: ESOPHAGOGASTRODUODENOSCOPY (EGD) WITH PROPOFOL;  Surgeon: Manya Silvas, MD;  Location: [facility] ENDOSCOPY;  Service: Endoscopy;  Laterality: N/A;    No family history on file.  SOCIAL HISTORY: Social History   Socioeconomic History  . Marital status: Married    Spouse name: Not on file  . Number of children: Not on file  . Years of education: Not on file  .  Highest education level: Not on file  Occupational History  . Not on file  Social Needs  . Financial resource strain: Not on file  . Food insecurity:    Worry: Not on file    Inability: Not on file  . Transportation needs:    Medical: Not on file    Non-medical: Not on file  Tobacco Use  . Smoking status: Former Smoker    Last attempt to quit: 04/08/2011    Years since quitting: 6.5  . Smokeless tobacco: Current User    Types: Chew  Substance and Sexual Activity  . Alcohol use: Yes    Alcohol/week: 1.2 oz    Types: 2 Cans of beer per week    Comment: binge daily  . Drug use: Not Currently  . Sexual activity: Not on file  Lifestyle  . Physical activity:    Days per week: Not on file    Minutes per session: Not on file  . Stress: Not on file  Relationships  . Social connections:    Talks on phone: Not on file    Gets together: Not on file    Attends religious service: Not on file    Active member of club or organization: Not on file    Attends meetings of clubs or organizations: Not on file    Relationship status: Not on file  . Intimate partner violence:  Fear of current or ex partner: Not on file    Emotionally abused: Not on file    Physically abused: Not on file    Forced sexual activity: Not on file  Other Topics Concern  . Not on file  Social History Narrative  . Not on file    No Known Allergies  Current Facility-Administered Medications  Medication Dose Route Frequency Provider Last Rate Last Dose  .  stroke: mapping our early stages of recovery book   Does not apply Once Amie Portland, MD      . 0.9 %  sodium chloride infusion (Manually program via Guardrails IV Fluids)   Intravenous Once Amie Portland, MD      . 0.9 %  sodium chloride infusion (Manually program via Guardrails IV Fluids)   Intravenous Once Amie Portland, MD      . 0.9 %  sodium chloride infusion   Intravenous Continuous Desai, Rahul P, PA-C      . acetaminophen (TYLENOL) tablet 650 mg   650 mg Oral Q4H PRN Deveshwar, Willaim Rayas, MD       Or  . acetaminophen (TYLENOL) solution 650 mg  650 mg Per Tube Q4H PRN Deveshwar, Willaim Rayas, MD       Or  . acetaminophen (TYLENOL) suppository 650 mg  650 mg Rectal Q4H PRN Deveshwar, Sanjeev, MD      . ceFAZolin (ANCEF) 2-4 GM/100ML-% IVPB           . chlorhexidine gluconate (MEDLINE KIT) (PERIDEX) 0.12 % solution 15 mL  15 mL Mouth Rinse BID Amie Portland, MD      . clevidipine (CLEVIPREX) infusion 0.5 mg/mL  0-21 mg/hr Intravenous Continuous Frederik Pear, MD 6 mL/hr at 11/11/2017 0619 3 mg/hr at 11/05/2017 0619  . famotidine (PEPCID) IVPB 20 mg premix  20 mg Intravenous Q12H Desai, Rahul P, PA-C      . fentaNYL (SUBLIMAZE) injection 50 mcg  50 mcg Intravenous Q15 min PRN Desai, Rahul P, PA-C      . fentaNYL (SUBLIMAZE) injection 50 mcg  50 mcg Intravenous Q2H PRN Desai, Rahul P, PA-C      . folic acid injection 1 mg  1 mg Intravenous Daily Desai, Rahul P, PA-C      . furosemide (LASIX) injection 40 mg  40 mg Intravenous Once Desai, Rahul P, PA-C      . heparin 1000 UNIT/ML injection           . MEDLINE mouth rinse  15 mL Mouth Rinse 10 times per day Amie Portland, MD      . nitroGLYCERIN 1 mg/10 ml (100 mcg/ml) - IR/CATH LAB    PRN Luanne Bras, MD   25 mcg at 11/24/2017 0433  . nitroGLYCERIN 100 MCG/ML intra-arterial injection           . nitroGLYCERIN 100 MCG/ML intra-arterial injection           . potassium chloride 20 MEQ/15ML (10%) solution 40 mEq  40 mEq Per Tube Once Desai, Rahul P, PA-C      . propofol (DIPRIVAN) 1000 MG/100ML infusion  0-50 mcg/kg/min Intravenous Continuous Desai, Rahul P, PA-C      . senna-docusate (Senokot-S) tablet 1 tablet  1 tablet Oral QHS PRN Amie Portland, MD      . simvastatin (ZOCOR) tablet 40 mg  40 mg Per Tube q1800 Desai, Rahul P, PA-C      . thiamine (B-1) injection 100 mg  100 mg Intravenous Daily Desai, Rahul P, PA-C  ROS:   Unable to obtain pt on ventilator  Physical  Examination  Vitals:   11/17/2017 0530 11/26/2017 0545 11/19/2017 0600  BP: 110/70 133/84 (!) 153/110  Pulse: 75 83 85  Resp: _0 SpO2: 100% 100% 100%  Weight: 154 lb 5.2 oz (70 kg)    Height: _1  (1.676 m)      Body mass index is 24.91 kg/m.  General:  Alert and oriented, no acute distress HEENT: Normal Neck: No JVD Pulmonary: Clear to auscultation bilaterally, on vent Cardiac: Irregularly irregular Abdomen: Soft, non-tender, non-distended, no mass, no scars Skin: No rash Extremity Pulses:  1+ radial, brachial, bilaterally, 2 + right femoral above the groin crease, needle puncture just below the groin crease,absent dorsalis pedis, posterior tibial pulses bilaterally, no doppler flow right foot, pale in appearance with some cap refill, DP doppler flow left foot, no PT doppler flow left foot, sluggish but better cap refill left foot, oozing around sheath left groin Musculoskeletal: No deformity or edema  Neurologic: unable to assess sedated  DATA:  CBC    Component Value Date/Time   WBC 6.3 10/28/2017 2315   RBC 4.42 10/28/2017 2315   HGB 13.5 10/28/2017 2315   HGB 11.0 (L) 02/01/2012 0354   HCT 39.1 (L) 10/28/2017 2315   HCT 36.1 (L) 01/27/2012 0329   PLT 232 10/28/2017 2315   PLT 186 02/01/2012 0354   MCV 88.6 10/28/2017 2315   MCV 96 01/27/2012 0329   MCH 30.5 10/28/2017 2315   MCHC 34.4 10/28/2017 2315   RDW 13.2 10/28/2017 2315   RDW 13.8 01/27/2012 0329   LYMPHSABS 1.8 10/28/2017 2315   LYMPHSABS 2.1 01/27/2012 0329   MONOABS 0.7 10/28/2017 2315   MONOABS 0.5 01/27/2012 0329   EOSABS 0.1 10/28/2017 2315   EOSABS 0.0 01/27/2012 0329   BASOSABS 0.1 10/28/2017 2315   BASOSABS 0.0 01/27/2012 0329    BMET    Component Value Date/Time   NA 137 10/28/2017 2315   NA 143 01/27/2012 0329   K 3.5 10/28/2017 2315   K 4.5 01/27/2012 0329   CL 105 10/28/2017 2315   CL 109 (H) 01/27/2012 0329   CO2 23 10/28/2017 2315   CO2 25 01/27/2012 0329   GLUCOSE 106 (H)  10/28/2017 2315   GLUCOSE 105 (H) 01/27/2012 0329   BUN 17 10/28/2017 2315   BUN 12 01/27/2012 0329   CREATININE 1.25 (H) 10/28/2017 2315   CREATININE 1.20 01/27/2012 0329   CALCIUM 8.7 (L) 10/28/2017 2315   CALCIUM 7.8 (L) 01/27/2012 0329   GFRNONAA 55 (L) 10/28/2017 2315   GFRNONAA >60 01/27/2012 0329   GFRAA >60 10/28/2017 2315   GFRAA >60 01/27/2012 0329   INR 2.6  Neuro images reviewed, recanalization of left carotid MCA system Left pelvis right and left common external iliacs patent with some irregularity of surface, right common femoral occlusion with minimal distal reconstitution, left common femoral SFA and profunda patent with sheath left common femoral artery  ASSESSMENT:  Most likely acute embolic ischemia right leg although possibly a chronic occlusion.  Regardless he currently has no flow to his right foot and is at risk of limb loss without revascularization.  He will obviously be high risk in light of acute stroke and recent neuro intervention.   PLAN:  To OR emergently for right femoral embolectomy possible fasciotomy.  Risks of worsening stroke bleeding infection possible limb loss discussed.  Will give 4 units ffp periop and discuss with neuro post op  anticoagulation plan  Ruta Hinds, MD [facility] Office: [phone] Pager: [phone]

## 2017-10-29 NOTE — Progress Notes (Signed)
Paged Dr Randie Heinzain, Vascular Surgery, at 11:30 regarding patient's wound vac on right lower extremity.  Wound vac is occluded and site is bleeding and pooling outside of dressing. Dr Randie Heinzain returned page and, via OR nurse, communicated that he could not come now and to get wound care RN to assess. Wound care nurses were paged.  Maple HudsonKaren Sanders, RN WOC came to patient's room at 1210 and assessed site.  She stated that she was not allowed to do first post-op wound vac change because of risks of bleeding issues needing MD attention. Dr Randie Heinzain was paged again at 1216 to relay message [facility].  OR RN conveyed message to him while in surgery.  Dr Randie Heinzain said he would come to see patient after he is finished in OR. Alaric Gladwin C 12:18 PM

## 2017-10-29 NOTE — Progress Notes (Addendum)
Pt been evaluated by Vascular surg and going to OR - appreciate Vascular surg input and recs. Stroke team will follow post OR recs per vascular and discuss anticoag options based on MRI and clinical course.

## 2017-10-29 NOTE — Progress Notes (Signed)
STROKE TEAM PROGRESS NOTE   SUBJECTIVE (INTERVAL HISTORY) His RN is at the bedside.  Patient is back from operation with Dr. Randie Heinz.  He had right femoral artery thrombectomy, endarterectomy with angioplasty and right leg fasciotomy.  Still intubated on propofol, not following commands.  Left side hemiplegia.  Right leg wound VAC with bleeding.   OBJECTIVE Temp:  [93.6 F (34.2 C)-100.4 F (38 C)] 100.4 F (38 C) (08/01 1800) Pulse Rate:  [36-128] 76 (08/01 1800) Cardiac Rhythm: Atrial fibrillation (08/01 1030) Resp:  [14-26] 20 (08/01 1800) BP: (87-182)/(48-110) 127/77 (08/01 1800) SpO2:  [92 %-100 %] 100 % (08/01 1800) Arterial Line BP: (110-162)/(38-78) 151/63 (08/01 1800) FiO2 (%):  [30 %-100 %] 30 % (08/01 1501) Weight:  [153 lb 7 oz (69.6 kg)-154 lb 5.2 oz (70 kg)] 154 lb 5.2 oz (70 kg) (08/01 0530)  Recent Labs  Lab 10/28/17 2313  GLUCAP 102*   Recent Labs  Lab 10/28/17 2315  NA 137  K 3.5  CL 105  CO2 23  GLUCOSE 106*  BUN 17  CREATININE 1.25*  CALCIUM 8.7*   Recent Labs  Lab 10/28/17 2315  AST 21  ALT 13  ALKPHOS 58  BILITOT 0.8  PROT 7.5  ALBUMIN 4.0   Recent Labs  Lab 10/28/17 2315 11/27/2017 1305  WBC 6.3  --   NEUTROABS 3.6  --   HGB 13.5 8.9*  HCT 39.1* 26.5*  MCV 88.6  --   PLT 232  --    Recent Labs  Lab 10/28/17 2315  TROPONINI <0.03   Recent Labs    10/28/17 2315  LABPROT 27.7*  INR 2.61   No results for input(s): COLORURINE, LABSPEC, PHURINE, GLUCOSEU, HGBUR, BILIRUBINUR, KETONESUR, PROTEINUR, UROBILINOGEN, NITRITE, LEUKOCYTESUR in the last 72 hours.  Invalid input(s): APPERANCEUR     Component Value Date/Time   CHOL 117 11/01/2017 0500   CHOL 126 01/27/2012 0329   TRIG 70 11/13/2017 0537   TRIG 64 01/27/2012 0329   HDL 37 (L) 11/18/2017 0500   HDL 43 01/27/2012 0329   CHOLHDL 3.2 11/13/2017 0500   VLDL 13 11/01/2017 0500   VLDL 13 01/27/2012 0329   LDLCALC 67 11/08/2017 0500   LDLCALC 70 01/27/2012 0329   Lab  Results  Component Value Date   HGBA1C 5.8 (H) 11/18/2017      Component Value Date/Time   LABOPIA NONE DETECTED 11/10/2017 0730   COCAINSCRNUR NONE DETECTED 11/04/2017 0730   LABBENZ NONE DETECTED 11/01/2017 0730   AMPHETMU NONE DETECTED 11/10/2017 0730   THCU NONE DETECTED 11/24/2017 0730   LABBARB NONE DETECTED 11/17/2017 0730    Recent Labs  Lab 10/28/17 2315  ETH <10    I have personally reviewed the radiological images below and agree with the radiology interpretations.  Ct Angio Head W Or Wo Contrast  Result Date: 11/20/2017 CLINICAL DATA:  Initial evaluation for EXAM: CT ANGIOGRAPHY HEAD AND NECK CT PERFUSION BRAIN TECHNIQUE: Multidetector CT imaging of the head and neck was performed using the standard protocol during bolus administration of intravenous contrast. Multiplanar CT image reconstructions and MIPs were obtained to evaluate the vascular anatomy. Carotid stenosis measurements (when applicable) are obtained utilizing NASCET criteria, using the distal internal carotid diameter as the denominator. Multiphase CT imaging of the brain was performed following IV bolus contrast injection. Subsequent parametric perfusion maps were calculated using RAPID software. CONTRAST:  OMNIPAQUE IOHEXOL 350 MG/ML SOLN COMPARISON:  Prior head CT from earlier the same day. FINDINGS: CTA NECK FINDINGS  Aortic arch: Visualized aortic arch of normal caliber with normal 3 vessel morphology. Atheromatous plaque about the use arch and origin of the great vessels without hemodynamically significant stenosis. Visualized subclavian arteries widely patent Right carotid system: Scattered atheromatous plaque within the right common carotid artery without stenosis. Atherosclerotic change about the right bifurcation/proximal right ICA without significant stenosis. Right ICA widely patent distally to the skull base without stenosis, dissection, or occlusion. Left carotid system: Left common carotid artery  patent from its origin to the bifurcation without significant stenosis. Abrupt occlusion of the left ICA just distal to the bifurcation. Left ICA remains occluded within the neck. Vertebral arteries: Both of the vertebral arteries arise from the subclavian arteries. Left vertebral artery dominant. Vertebral arteries patent within the neck without stenosis, dissection, or occlusion. Skeleton: No acute osseus abnormality. No discrete lytic or blastic osseous lesions. Moderate cervical spondylolysis at C4-5 through C6-7. Other neck: No other acute abnormality within the neck. Upper chest: Visualized upper chest demonstrates no acute finding. Severe emphysema. Review of the MIP images confirms the above findings CTA HEAD FINDINGS Anterior circulation: Left ICA remains occluded to the terminus. Left M1 segment and proximal left MCA branches occluded. Scant collateral flow seen distally. Multifocal atheromatous plaque throughout the cavernous/supraclinoid right ICA with moderate multifocal stenosis right M1 widely patent. No proximal right M2 occlusion distal right MCA branches demonstrate atheromatous irregularity but are perfused to their distal aspects. Right A1 patent. Retrograde opacification of the left A1 which appears slightly hypoplastic. Azygos ACA widely patent to its distal aspects. Posterior circulation: Vertebral arteries patent to the vertebrobasilar junction without stenosis. Left vertebral artery dominant posterior inferior cerebral arteries patent bilaterally. Basilar patent to its distal aspect without stenosis. Superior cerebral arteries patent bilaterally. Both of the PCAs patent to their distal aspects. Prominent posterior communicating arteries noted bilaterally. Venous sinuses: Patent. Anatomic variants: None significant. Delayed phase: 5 mm focus of parenchymal enhancement along the gray-white matter differentiation of the anterior right frontal lobe noted (series 13, image 20), indeterminate, but  favored to be vascular in nature. No surrounding edema or other abnormality. No other abnormal enhancement. Review of the MIP images confirms the above findings CT Brain Perfusion Findings: CBF (<30%) Volume: 40mL Perfusion (Tmax>6.0s) volume: Mismatch Volume: Infarction Location:Evidence for acute ischemic infarct involving the left MCA distribution, with involvement of the left insular region and subcortical and deep white matter of the left cerebral hemisphere. Surrounding ischemic penumbra involves much of the left cerebral hemisphere, with additional scattered perfusion abnormality within the right cerebral hemisphere. Findings are felt to be at least partially artifactual in nature. IMPRESSION: 1. Acute EVLO with occlusion of the left ICA just distal to the bifurcation. Left ICA remains occluded to the terminus with absent flow within the left MCA artery. Fairly mild collateralization seen distally within the left MCA distribution. 2. Evidence for core infarct involving the left cerebral hemisphere as above. Surrounding ischemic penumbra with perfusion mismatch volume of 483 mL, suspected to at least in part be artifactual in nature on this examination. However, a fairly substantial surrounding penumbra is still somewhat suspected. 3. Patent azygos ACA. 4. Moderate atheromatous plaque throughout the right carotid siphon with moderate multifocal stenosis. No other high-grade or hemodynamically significant stenosis identified. 5. Emphysema. Critical Value/emergent results were called by telephone at the time of interpretation on 11/25/2017 at 12:35 am to Dr. Reesa Chew , who verbally acknowledged these results. Electronically Signed   By: Rise Mu M.D.   On:  11/24/2017 00:56   Ct Angio Neck W Or Wo Contrast  Result Date: 11/08/2017 CLINICAL DATA:  Initial evaluation for EXAM: CT ANGIOGRAPHY HEAD AND NECK CT PERFUSION BRAIN TECHNIQUE: Multidetector CT imaging of the head and neck was  performed using the standard protocol during bolus administration of intravenous contrast. Multiplanar CT image reconstructions and MIPs were obtained to evaluate the vascular anatomy. Carotid stenosis measurements (when applicable) are obtained utilizing NASCET criteria, using the distal internal carotid diameter as the denominator. Multiphase CT imaging of the brain was performed following IV bolus contrast injection. Subsequent parametric perfusion maps were calculated using RAPID software. CONTRAST:  100mL OMNIPAQUE IOHEXOL 350 MG/ML SOLN COMPARISON:  Prior head CT from earlier the same day. FINDINGS: CTA NECK FINDINGS Aortic arch: Visualized aortic arch of normal caliber with normal 3 vessel morphology. Atheromatous plaque about the use arch and origin of the great vessels without hemodynamically significant stenosis. Visualized subclavian arteries widely patent Right carotid system: Scattered atheromatous plaque within the right common carotid artery without stenosis. Atherosclerotic change about the right bifurcation/proximal right ICA without significant stenosis. Right ICA widely patent distally to the skull base without stenosis, dissection, or occlusion. Left carotid system: Left common carotid artery patent from its origin to the bifurcation without significant stenosis. Abrupt occlusion of the left ICA just distal to the bifurcation. Left ICA remains occluded within the neck. Vertebral arteries: Both of the vertebral arteries arise from the subclavian arteries. Left vertebral artery dominant. Vertebral arteries patent within the neck without stenosis, dissection, or occlusion. Skeleton: No acute osseus abnormality. No discrete lytic or blastic osseous lesions. Moderate cervical spondylolysis at C4-5 through C6-7. Other neck: No other acute abnormality within the neck. Upper chest: Visualized upper chest demonstrates no acute finding. Severe emphysema. Review of the MIP images confirms the above findings  CTA HEAD FINDINGS Anterior circulation: Left ICA remains occluded to the terminus. Left M1 segment and proximal left MCA branches occluded. Scant collateral flow seen distally. Multifocal atheromatous plaque throughout the cavernous/supraclinoid right ICA with moderate multifocal stenosis right M1 widely patent. No proximal right M2 occlusion distal right MCA branches demonstrate atheromatous irregularity but are perfused to their distal aspects. Right A1 patent. Retrograde opacification of the left A1 which appears slightly hypoplastic. Azygos ACA widely patent to its distal aspects. Posterior circulation: Vertebral arteries patent to the vertebrobasilar junction without stenosis. Left vertebral artery dominant posterior inferior cerebral arteries patent bilaterally. Basilar patent to its distal aspect without stenosis. Superior cerebral arteries patent bilaterally. Both of the PCAs patent to their distal aspects. Prominent posterior communicating arteries noted bilaterally. Venous sinuses: Patent. Anatomic variants: None significant. Delayed phase: 5 mm focus of parenchymal enhancement along the gray-white matter differentiation of the anterior right frontal lobe noted (series 13, image 20), indeterminate, but favored to be vascular in nature. No surrounding edema or other abnormality. No other abnormal enhancement. Review of the MIP images confirms the above findings CT Brain Perfusion Findings: CBF (<30%) Volume: 40mL Perfusion (Tmax>6.0s) volume: 523mL Mismatch Volume: 483mL Infarction Location:Evidence for acute ischemic infarct involving the left MCA distribution, with involvement of the left insular region and subcortical and deep white matter of the left cerebral hemisphere. Surrounding ischemic penumbra involves much of the left cerebral hemisphere, with additional scattered perfusion abnormality within the right cerebral hemisphere. Findings are felt to be at least partially artifactual in nature.  IMPRESSION: 1. Acute EVLO with occlusion of the left ICA just distal to the bifurcation. Left ICA remains occluded to the terminus with  absent flow within the left MCA artery. Fairly mild collateralization seen distally within the left MCA distribution. 2. Evidence for core infarct involving the left cerebral hemisphere as above. Surrounding ischemic penumbra with perfusion mismatch volume of 483 mL, suspected to at least in part be artifactual in nature on this examination. However, a fairly substantial surrounding penumbra is still somewhat suspected. 3. Patent azygos ACA. 4. Moderate atheromatous plaque throughout the right carotid siphon with moderate multifocal stenosis. No other high-grade or hemodynamically significant stenosis identified. 5. Emphysema. Critical Value/emergent results were called by telephone at the time of interpretation on 11/13/2017 at 12:35 am to Dr. Reesa Chew , who verbally acknowledged these results. Electronically Signed   By: Rise Mu M.D.   On: 11/09/2017 00:56   Ct Cerebral Perfusion W Contrast  Result Date: 11/05/2017 CLINICAL DATA:  Initial evaluation for EXAM: CT ANGIOGRAPHY HEAD AND NECK CT PERFUSION BRAIN TECHNIQUE: Multidetector CT imaging of the head and neck was performed using the standard protocol during bolus administration of intravenous contrast. Multiplanar CT image reconstructions and MIPs were obtained to evaluate the vascular anatomy. Carotid stenosis measurements (when applicable) are obtained utilizing NASCET criteria, using the distal internal carotid diameter as the denominator. Multiphase CT imaging of the brain was performed following IV bolus contrast injection. Subsequent parametric perfusion maps were calculated using RAPID software. CONTRAST:  OMNIPAQUE IOHEXOL 350 MG/ML SOLN COMPARISON:  Prior head CT from earlier the same day. FINDINGS: CTA NECK FINDINGS Aortic arch: Visualized aortic arch of normal caliber with normal 3 vessel  morphology. Atheromatous plaque about the use arch and origin of the great vessels without hemodynamically significant stenosis. Visualized subclavian arteries widely patent Right carotid system: Scattered atheromatous plaque within the right common carotid artery without stenosis. Atherosclerotic change about the right bifurcation/proximal right ICA without significant stenosis. Right ICA widely patent distally to the skull base without stenosis, dissection, or occlusion. Left carotid system: Left common carotid artery patent from its origin to the bifurcation without significant stenosis. Abrupt occlusion of the left ICA just distal to the bifurcation. Left ICA remains occluded within the neck. Vertebral arteries: Both of the vertebral arteries arise from the subclavian arteries. Left vertebral artery dominant. Vertebral arteries patent within the neck without stenosis, dissection, or occlusion. Skeleton: No acute osseus abnormality. No discrete lytic or blastic osseous lesions. Moderate cervical spondylolysis at C4-5 through C6-7. Other neck: No other acute abnormality within the neck. Upper chest: Visualized upper chest demonstrates no acute finding. Severe emphysema. Review of the MIP images confirms the above findings CTA HEAD FINDINGS Anterior circulation: Left ICA remains occluded to the terminus. Left M1 segment and proximal left MCA branches occluded. Scant collateral flow seen distally. Multifocal atheromatous plaque throughout the cavernous/supraclinoid right ICA with moderate multifocal stenosis right M1 widely patent. No proximal right M2 occlusion distal right MCA branches demonstrate atheromatous irregularity but are perfused to their distal aspects. Right A1 patent. Retrograde opacification of the left A1 which appears slightly hypoplastic. Azygos ACA widely patent to its distal aspects. Posterior circulation: Vertebral arteries patent to the vertebrobasilar junction without stenosis. Left vertebral  artery dominant posterior inferior cerebral arteries patent bilaterally. Basilar patent to its distal aspect without stenosis. Superior cerebral arteries patent bilaterally. Both of the PCAs patent to their distal aspects. Prominent posterior communicating arteries noted bilaterally. Venous sinuses: Patent. Anatomic variants: None significant. Delayed phase: 5 mm focus of parenchymal enhancement along the gray-white matter differentiation of the anterior right frontal lobe noted (series 13, image 20),  indeterminate, but favored to be vascular in nature. No surrounding edema or other abnormality. No other abnormal enhancement. Review of the MIP images confirms the above findings CT Brain Perfusion Findings: CBF (<30%) Volume: 40mL Perfusion (Tmax>6.0s) volume: Mismatch Volume: Infarction Location:Evidence for acute ischemic infarct involving the left MCA distribution, with involvement of the left insular region and subcortical and deep white matter of the left cerebral hemisphere. Surrounding ischemic penumbra involves much of the left cerebral hemisphere, with additional scattered perfusion abnormality within the right cerebral hemisphere. Findings are felt to be at least partially artifactual in nature. IMPRESSION: 1. Acute EVLO with occlusion of the left ICA just distal to the bifurcation. Left ICA remains occluded to the terminus with absent flow within the left MCA artery. Fairly mild collateralization seen distally within the left MCA distribution. 2. Evidence for core infarct involving the left cerebral hemisphere as above. Surrounding ischemic penumbra with perfusion mismatch volume of 483 mL, suspected to at least in part be artifactual in nature on this examination. However, a fairly substantial surrounding penumbra is still somewhat suspected. 3. Patent azygos ACA. 4. Moderate atheromatous plaque throughout the right carotid siphon with moderate multifocal stenosis. No other high-grade or  hemodynamically significant stenosis identified. 5. Emphysema. Critical Value/emergent results were called by telephone at the time of interpretation on 11/28/2017 at 12:35 am to Dr. Reesa Chew , who verbally acknowledged these results. Electronically Signed   By: Rise Mu M.D.   On: 11/25/2017 00:56   Ct Head Code Stroke Wo Contrast  Result Date: 10/28/2017 CLINICAL DATA:  Code stroke. Initial evaluation for acute right-sided weakness. EXAM: CT HEAD WITHOUT CONTRAST TECHNIQUE: Contiguous axial images were obtained from the base of the skull through the vertex without intravenous contrast. COMPARISON:  None available. FINDINGS: Brain: Generalized age-related cerebral atrophy with chronic small vessel ischemic disease. Remote lacunar infarcts within the left basal ganglia and thalamus. There is subtle asymmetric hypodensity at the posterior left insula extending posteriorly into the left M6 territory, with additional possible hypodensity within the left M2 territory, concerning for evolving acute ischemic left MCA territory infarct. No acute intracranial hemorrhage. No mass lesion, midline shift or mass effect. No hydrocephalus. No extra-axial fluid collection. Vascular: Asymmetric hyperdensity involving the left M1 segment extending into proximal left M2 branches, concerning for low thrombus in large vessel occlusion. Scattered vascular calcifications noted within the carotid siphons. Skull: Scalp soft tissues and calvarium within normal limits. Sinuses/Orbits: Globes and orbital soft tissues within normal limits. Paranasal sinuses and mastoids are clear. Other: None. ASPECTS [facility] Stroke Program Early CT Score) - Ganglionic level infarction (caudate, lentiform nuclei, internal capsule, insula, M1-M3 cortex): 5 - Supraganglionic infarction (M4-M6 cortex): 2 Total score (0-10 with 10 being normal): 7 IMPRESSION: 1. Asymmetric hyperdensity within the left M1 segment, concerning for large vessel  occlusion. Subtle early hypodensity within the left cerebral hemisphere concerning for acute left MCA territory infarct. No acute intracranial hemorrhage. 2. ASPECTS is 7. Critical Value/emergent results were called by telephone at the time of interpretation on 10/28/2017 at 11:40 pm to Dr. Lesly Rubenstein SUNG , who verbally acknowledged these results. Electronically Signed   By: Rise Mu M.D.   On: 10/28/2017 23:42   DSA endovascular revascularization of occluded Lt ICA distally ,Lt MCA and LT ACA with x 3 passes with tembotrap x 33mm retriever device and  X 1 pass with the 3mm x 20 mm treprovue retriever device achiving a TICI 2 b revascularization.   TTE  -  Left ventricle: The cavity size was normal. Wall thickness was   normal. Systolic function was mildly to moderately reduced. The   estimated ejection fraction was in the range of 40% to 45%. - Aortic valve: nodular calcification of leaflet tips. There was   mild regurgitation. Valve area (VTI): 1.77 cm^2. Valve area   (Vmax): 2.95 cm^2. Valve area (Vmean): 2.18 cm^2. - Mitral valve: Nodular calcification of leaflet tips with   diastolic bowing moderate appearing MS. Severely calcified   annulus. Moderately thickened, moderately calcified leaflets .   There was mild regurgitation. Valve area by pressure half-time:   2.04 cm^2. Valve area by continuity equation (using LVOT flow):   0.84 cm^2. - Left atrium: The atrium was severely dilated. - Right atrium: The atrium was severely dilated. - Atrial septum: No defect or patent foramen ovale was identified. - Tricuspid valve: There was moderate regurgitation. - Pulmonary arteries: PA peak pressure: 41 mm Hg (S). - Pericardium, extracardiac: A trivial pericardial effusion was   identified posterior to the heart.  MRI and MRA pending   PHYSICAL EXAM  Temp:  [93.6 F (34.2 C)-100.4 F (38 C)] 100.4 F (38 C) (08/01 1800) Pulse Rate:  [36-128] 76 (08/01 1800) Resp:  [14-26] 20  (08/01 1800) BP: (87-182)/(48-110) 127/77 (08/01 1800) SpO2:  [92 %-100 %] 100 % (08/01 1800) Arterial Line BP: (110-162)/(38-78) 151/63 (08/01 1800) FiO2 (%):  [30 %-100 %] 30 % (08/01 1501) Weight:  [153 lb 7 oz (69.6 kg)-154 lb 5.2 oz (70 kg)] 154 lb 5.2 oz (70 kg) (08/01 0530)  General - Well nourished, well developed, intubated and sedated.  Ophthalmologic - fundi not visualized due to noncooperation.  Cardiovascular - irregularly irregular heart rate and rhythm.  Neuro -intubated on sedation, not following commands, not open eyes with voice or pain stimulation.  With eyelid force opening, PERRL, sluggish doll's eyes, not blinking to visual threat bilaterally.  Absence of corneal reflexes, weak gag and cough.  Facial asymmetry not able to tell due to ET tube.  On pain stimulation, moving left upper and lower extremities, localizing to pain.  However, right upper extremity 0/5 and right lower extremity 1/5.  DTR 1+, Babinski positive on the right. Sensation, coordination and gait not tested.    ASSESSMENT/PLAN Mr. Hunter Adkins is a 77 y.o. male with history of CHF, cardiomyopathy, A. fib on Coumadin, history of DVT, alcohol use admitted for aphasia, right-sided weakness, left gaze and right knee leg. No tPA given due to therapeutic INR.    Stroke:  Left hemisphere infarct due to left ICA occlusion status post thrombectomy, embolic secondary to A. fib  Resultant intubated and sedated with right hemiplegia  CT head left MCA hyperdense sign, left MCA territory subtle infarct  CTA head and neck left ICA, MCA, and ACA occlusion, moderate right ICA bulb and siphon atherosclerosis  MRI and MRA pending  2D Echo EF 40 to 45%  LDL 67  HgbA1c 5.8  SCDs for VTE prophylaxis  warfarin daily prior to admission, now on No antithrombotic due to active bleeding and anemia.  Ongoing aggressive stroke risk factor management  Therapy recommendations:  Pending   Disposition:  Pending    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  Had emergent right femoral embolectomy  Had right common femoral artery enterectomy with vein patch angioplasty  Had right LE 4 compartment fasciotomies  Wound VAC at right LE  VVS on board  Afib on coumadin  INR 2.61, therapeutic  May  consider switch to NOAC when appropriate for stroke prevention  Acute blood loss anemia  Hemoglobin 13.5-> 8.9  Likely related to current procedure and surgeries  Close CBC monitoring  PRBC transfusion if less than 7.0  BP management Stable  BP goal 120-140 post procedure  All Cleviprex, Neo, and propofol and Lasix  Hyperlipidemia  Home meds: Zocor  LDL 69, goal < 70  Now on Zocor 40  Continue statin at discharge  Other Stroke Risk Factors  Advanced age  Former cigarette smoker quit 6 years ago  ETOH use -on folic acid and B1  Cardiomyopathy -EF 40 to 45%  History of DVT  Other Active Problems  Elevated creatinine 1.25  Hospital day # 0  This patient is critically ill due to left hemispheric stroke, 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 fib, anemia and at significant risk of neurological worsening, death form recurrent stroke, hemorrhagic conversion, right leg ischemia, heart failure, hypovolemic shock. This patient's care requires constant monitoring of vital signs, hemodynamics, respiratory and cardiac monitoring, review of multiple databases, neurological assessment, discussion with family, other specialists and medical decision making of high complexity. I spent 45 minutes of neurocritical care time in the care of this patient.   Marvel Plan, MD PhD Stroke Neurology 11/08/2017 6:31 PM    To contact Stroke Continuity provider, please refer to [email or web address]. After hours, contact General Neurology

## 2017-10-29 NOTE — Progress Notes (Signed)
OT Cancellation Note  Patient Details Name: Geri SeminoleJerry D Colwell MRN: [id number] DOB: 05/17/1940   Cancelled Treatment:    Reason Eval/Treat Not Completed: Medical issues which prohibited therapy. Pt in OR.  Evern BioLaura J Tesean Stump 11/10/2017, 7:57 AM  Sherryl MangesLaura Kashayla Ungerer OTR/L [phone]

## 2017-10-29 NOTE — Progress Notes (Signed)
Patient came to 4N ICU at [id number].  Femoral sheath site began continuous profuse bleeding.  Pressure was applied and IR MD was paged and alerted.  Patient's RLE was pulseless, per baseline.  Pulses on LLE were present and assessed via doppler.  Orders were given to continue holding pressure and proceed with giving FFP as planned.  Femoral site continued to bleed, despite applied pressure, until the patient left for the OR at 0720.

## 2017-10-29 NOTE — Procedures (Signed)
Lt common carotid arteriogram followed by endovascular revascularization of occluded Lt ICA distally ,Lt MCA and LT ACA with x 3 passes with tembotrap x 33mm retriever device and  X 1 pass with the 3mm x 20 mm treprovue retriever device achiving a TICI 2 b revascularization.

## 2017-10-29 NOTE — Transfer of Care (Signed)
Immediate Anesthesia Transfer of Care Note  Patient: Hunter Adkins  Procedure(s) Performed: IR WITH ANESTHESIA (N/A )  Patient Location: ICU  Anesthesia Type:General  Level of Consciousness: sedated and Patient remains intubated per anesthesia plan  Airway & Oxygen Therapy: Patient remains intubated per anesthesia plan and Patient placed on Ventilator (see vital sign flow sheet for setting)  Post-op Assessment: Report given to RN and Post -op Vital signs reviewed and stable  Post vital signs: Reviewed and stable  Last Vitals:  Vitals Value Taken Time  BP 110/70 11/06/2017  5:30 AM  Temp    Pulse 36 11/23/2017  5:39 AM  Resp 14 11/03/2017  5:39 AM  SpO2 96 % 10/30/2017  5:39 AM  Vitals shown include unvalidated device data.  Last Pain: There were no vitals filed for this visit.       Complications: No apparent anesthesia complications

## 2017-10-29 NOTE — Progress Notes (Signed)
Molli HazardMatthew, GeorgiaPA with Vascular surgery came to assess patient.  Dressing on medial side of right leg was taken down, pressure held, area cleaned, and wound vac reapplied.  Will continue to monitor site and wound vac for any complications. Amelia Macken C

## 2017-10-29 NOTE — Progress Notes (Signed)
PT Cancellation Note  Patient Details Name: Hunter Adkins MRN: [id number] DOB: 1940/08/09   Cancelled Treatment:    Reason Eval/Treat Not Completed: Medical issues which prohibited therapy;Active bedrest order.  Went to OR today.  Await more medical stability.  Thanks,    Rollene Rotundaebecca B. Aleea Hendry, PT, DPT [phone]   11/14/2017, 5:55 PM

## 2017-10-29 NOTE — ED Notes (Signed)
Back from CT scan

## 2017-10-29 NOTE — Consult Note (Signed)
WOC Nurse wound consult note Reason for Consult:Fasciotomy today 11/07/2017 and returns from OR with alarming NPWT (VAC) device and bulging dressing indicating nonintact seal to right medial lower leg site.  Not within WOC scope to remove this fresh postoperative site while surgeon is not available to be at bedside.  Surgeon is notified of this policy via bedside RN and will come to assess the wound when out of surgery.  Bedside RN to monitor vital signs and hemodynamic status.   Wound type:surgical wound.  Pressure Injury POA:NA Will not follow at this time.  Please re-consult if needed.  Maple HudsonKaren Leng Montesdeoca RN BSN CWON Pager [phone]

## 2017-10-29 NOTE — Progress Notes (Signed)
eLink Physician-Brief Progress Note Patient Name: [name] DOB: 1941-02-27 MRN: [id number]   Date of Service  11/08/2017  HPI/Events of Note  Post-op hypertension. Per Dr. Milon DikesAshish Arora SBP goal is < 140 mmHg. Clevipex okay to use for BP control. Current BP 182/99.  eICU Interventions  Cleviprex infusion ordered.        [name] 11/05/2017, 5:26 AM

## 2017-10-29 NOTE — Progress Notes (Signed)
SLP Cancellation Note  Patient Details Name: Hunter Adkins MRN: [id number] DOB: [birth year]   Cancelled treatment:       Reason Eval/Treat Not Completed: Patient at procedure or test/unavailable   Nettye Flegal, Riley NearingBonnie Caroline 11/20/2017, 8:30 AM

## 2017-10-29 NOTE — Op Note (Signed)
Patient name: Hunter Adkins MRN: [id number] DOB: 1940-08-26 Sex: male  11/15/2017 Pre-operative Diagnosis: Acute right lower extremity ischemia Post-operative diagnosis:  Same Surgeon:  Apolinar JunesBrandon C. Randie Heinzain, MD Assistant: Clinton GallantEmma Collins, PA Procedure Performed: 1.  Minx device closure left common femoral artery 2.  Harvest right greater saphenous vein 3.  Right common femoral endarterectomy with vein patch angioplasty 4.  Right ower extremity thromboembolectomy  5.  Right lower extremity 4 compartment fasciotomies   Indications: 77 year old male suffered an embolic stroke to his left MCA.  He was found to have no signals in his right foot and was concerned for embolic disease there as well.  They also could not access his right common femoral artery during neuro salvage procedure and he now has a sheath in the left common femoral artery that has oozing around it.  Findings: Left common femoral artery was oozing around the sheath.  I exchanged this for separate sheath deployed a minx device and obtain hemostasis.  He did have signals at the posterior tibial anterior tibial at the ankle on the left side.  On the right side there was chronic scarring in the right groin from what appeared to be previous endovascular procedures.  Common femoral artery was heavily calcified there was minimal thrombus within the common femoral artery.  There was a large greater saphenous vein was harvested approximately 4 mm in diameter was free of disease.  After endarterectomy there was very strong inflow as well as backbleeding from the SFA and profunda femoris arteries and a vein patch was placed.  Embolectomy did not return any clot from distal SFA.  There was a strong anterior tibial signal and a palpable popliteal pulse at completion.  Upon fasciotomy there was good bleeding from the muscle that was all viable.   Procedure:  The patient was identified in the holding area and taken to the operating room and placed  supine on the operative table.  He was already intubated he was hooked up to general anesthesia he was given antibiotics and a timeout was called.  He was sterilely prepped his left groin and right lower extremity prior to this.  We began by exchanging over a Bentson wire for a separate 7 French sheath in the left groin.  I attempted to avoid pro-glide device but this did not deploy.  I then used a 7 [place] minx device this point well pressure was held hemostasis was obtained he did have signals distally at completion of the left ankle with anterior tibial posterior tibial arteries.  He was already obtaining FFP during this case for elevated INR.  Longitudinal incision was made we dissected down the common femoral artery.  There is heavy scarring and I could identify Prolene that would likely from percutaneous closure devices.  We dissected up on the external iliac artery and placed a vessel loop around this.  We are able to dissected out the profunda and SFA and placed Vesseloops around them as well.  7000 use of heparin was given.  Given the heavy calcification and the lack of a pulse in the common femoral artery elected to make a longitudinal incision.  I did encounter a small amount of thrombus there as well as heavy calcification.  I performed an endarterectomy down onto the SFA with eversion of the profunda and was able to pull heavy calcium from the external iliac artery as well.  There was then some very strong inflow.  I irrigated with heparinized saline.  Distally I  tacked the plaque in the SFA with 7-0 Prolene sutures x3.  I did pass a Fogarty catheter down the SFA but returned no clot.  I then harvested the greater saphenous vein is far as I could get through the same wound clamped distally and tied off with 2-0 silk suture.  At the saphenofemoral junction and divided and oversewed it with 5-0 Prolene suture in a mattress fashion.  The vein was then opened longitudinally reversed and sewn in place over  the endarterectomy site with 5-0 Prolene.  Prior to the completion anastomosis we allowed flushing in all directions and irrigated with heparinized saline.  Upon completion there was an area of injury to the profundal that was repaired with interrupted 5-0 Prolene suture.  We then had good pulsatility of the profundal as well as the SFA.  There was a good signal at the anterior tibial at the ankle as well as a palpable popliteal pulse.  Given the length of time for ischemia I elected to perform fasciotomies.  Medial and lateral leg incisions were made.  We dissected down divided the fascia and the deep and superficial compartments posteriorly.  On the lateral leg we opened up the anterior and lateral compartments.  All muscle appeared viable.  Patient was given 50 mg of protamine at this time.  He did tolerate it well.  Wound vacs were fashioned to the medial lateral fasciotomy sites.  The groin wound was irrigated closed in layers with Vicryl Monocryl and Dermabond was placed to level the skin.  Patient will be transferred to the ICU in critical condition.  He did tolerate this procedure well without immediate complications.  All counts were correct at completion.  Next  EBL 550 cc.   Derrek Puff C. Randie Heinz, MD [facility] Office: [phone] Pager: [phone]

## 2017-10-29 NOTE — Anesthesia Postprocedure Evaluation (Signed)
Anesthesia Post Note  Patient: Hunter Adkins  Procedure(s) Performed: IR WITH ANESTHESIA (N/A )     Patient location during evaluation: ICU Anesthesia Type: General Level of consciousness: patient remains intubated per anesthesia plan Pain management: pain level controlled Vital Signs Assessment: post-procedure vital signs reviewed and stable Respiratory status: patient remains intubated per anesthesia plan Cardiovascular status: stable Anesthetic complications: yes    Last Vitals:  Vitals:   08-Sep-2017 0545 08-Sep-2017 0600  BP: 133/84 (!) 153/110  Pulse: 83 85  Resp: 14 19  SpO2: 100% 100%    Last Pain: There were no vitals filed for this visit.               Demeisha Geraghty

## 2017-10-29 NOTE — Progress Notes (Signed)
Patient admitted early am by night intensivist. Came to see in am was in or Checked with nurse patient is doing well post or HB slight drop to 8.9 BP is 120s on low dose phenylephrine Talked to nurse monitor HB and CK IF need for higher pressure consider picc line  ABG ok K replaced If continue to do well consider weanign in am No bill as seen by am attending  Roseanne Renoutul Nalea Salce Pulmonary Critical Care & Sleep Medicine

## 2017-10-29 NOTE — Transfer of Care (Signed)
Immediate Anesthesia Transfer of Care Note  Patient: Hunter Adkins  Procedure(s) Performed: EMBOLECTOMY RIGHT LOWER LEG (Right Groin) ENDARTERECTOMY COMMON FEMORAL (Right Groin) PATCH ANGIOPLASTY USING VEIN (Right Groin) VEIN HARVEST RIGHT (Right Groin) FASCIOTOMY (Right Leg Lower) APPLICATION OF WOUND VAC RIGHT LOWER LEG (Right Leg Lower) CLOSURE OF LEFT FEMORAL ACCESS USING MYNX DEVICE (Left Groin)  Patient Location: ICU  Anesthesia Type:General  Level of Consciousness: sedated and Patient remains intubated per anesthesia plan  Airway & Oxygen Therapy: Patient remains intubated per anesthesia plan and Patient placed on Ventilator (see vital sign flow sheet for setting)  Post-op Assessment: Report given to RN and Post -op Vital signs reviewed and stable  Post vital signs: Reviewed and stable  Last Vitals:  Vitals Value Taken Time  BP    Temp 34.2 C 11/10/2017 10:24 AM  Pulse 78 11/01/2017 10:24 AM  Resp 16 11/26/2017 10:24 AM  SpO2 100 % 11/01/2017 10:24 AM  Vitals shown include unvalidated device data.  Last Pain: There were no vitals filed for this visit.       Complications: No apparent anesthesia complications

## 2017-10-29 NOTE — Anesthesia Procedure Notes (Signed)
Arterial Line Insertion Start/End08/22/2019 7:43 AM, 11/12/2017 7:45 AM Performed by: Mal AmabileFoster, Azie Mcconahy, MD, anesthesiologist  Patient location: OR. Preanesthetic checklist: patient identified, IV checked, site marked, risks and benefits discussed, surgical consent, monitors and equipment checked, pre-op evaluation, timeout performed and anesthesia consent radial was placed Catheter size: 20 G Hand hygiene performed  and maximum sterile barriers used  Allen's test indicative of satisfactory collateral circulation Attempts: 1 Procedure performed without using ultrasound guided technique. Following insertion, Biopatch. Post procedure assessment: normal  Patient tolerated the procedure well with no immediate complications.

## 2017-10-29 NOTE — Anesthesia Postprocedure Evaluation (Signed)
Anesthesia Post Note  Patient: Hunter Adkins  Procedure(s) Performed: EMBOLECTOMY RIGHT LOWER LEG (Right Groin) ENDARTERECTOMY COMMON FEMORAL (Right Groin) PATCH ANGIOPLASTY USING VEIN (Right Groin) VEIN HARVEST RIGHT (Right Groin) FASCIOTOMY (Right Leg Lower) APPLICATION OF WOUND VAC RIGHT LOWER LEG (Right Leg Lower) CLOSURE OF LEFT FEMORAL ACCESS USING MYNX DEVICE (Left Groin)     Patient location during evaluation: ICU Anesthesia Type: General Level of consciousness: patient remains intubated per anesthesia plan Pain management: pain level controlled Vital Signs Assessment: post-procedure vital signs reviewed and stable Respiratory status: patient remains intubated per anesthesia plan Cardiovascular status: blood pressure returned to baseline and stable Postop Assessment: no apparent nausea or vomiting Anesthetic complications: no    Last Vitals:  Vitals:   11/25/2017 0700 11/03/2017 0718  BP: 122/84 122/84  Pulse: 67 82  Resp: 14 (!) 24  SpO2: 100% 100%    Last Pain: There were no vitals filed for this visit.               Dianne Bady A.

## 2017-10-29 NOTE — ED Notes (Signed)
Pt going back to CT scan

## 2017-10-29 NOTE — Progress Notes (Signed)
Patient ID: Hunter Adkins, male   DOB: 01/23/1941, 77 y.o.   MRN: [id number] INR. 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 Sudden onset of aphasia ,RT sided weakness and gaze deviation. CT Brain . No ICH ASPECTS 9 CTA LT ICA ,LT MCA and  Lt ACA occlusion. CTP CBF 30 ml vol 40 ml . Tmax > 60 sec vol ?523ml . Mismatch 483 ml Ratio 13.1  Option of endovascular revascularization of  Of LT ICA <LT MCA and Lt ACA discussed with spouse and daughter in law. Reasons risks,altrnatives all reviewed. Risks of ICH of 10 % ,with worsening neuro deficit ,vent dependency,death ,inability to revascularize,vascular injury all discussed. Spouse and daughter in law expressed understanding and gave informed witnessed consent to proceed with endovascular treatment under [place]. S.Markesha Hannig MD

## 2017-10-29 NOTE — Consult Note (Signed)
PULMONARY / CRITICAL CARE MEDICINE   Name: Hunter Adkins MRN: [id number] DOB: 1940-10-16    ADMISSION DATE:  Nov 14, 2017 CONSULTATION DATE:  Nov 14, 2017  REFERRING MD:  Wilford Corner  CHIEF COMPLAINT:  AMS  HISTORY OF PRESENT ILLNESS:  Pt is encephelopathic; therefore, this HPI is obtained from chart review. Hunter Adkins is a 77 y.o. male with PMH as outlined below including but not limited to AF on coumadin, CHF, COPD, DVT, EtOH abuse.  He was in his usual state of health evening of 7/31 and ws at a bar at a pool tournament when he had sudden onset of aphasia and right sided weakness around 2226.  He was taken to [facility] ED where he was found to have mutism, left gaze preference, right hemianopsia, right hemiplegia.  CT of head showed evolving left MCA infarct and CTA showed left ICA occlusion and left M1 and A1 occlusion.  He was deemed to not be a candidate for tPA due to being therapeutic on coumadin.  He was subsequently transferred to [facility] where he was taken to IR and had left common carotid arteriogram followed by complete revascularization of occluded left ICA, left MCA, left ACA.  Post procedure, he returned to the ICU on vent and PCCM was asked to assist with vent management.  Of note, he was also found to have cold RLE prior to IR procedure with no palpable pulses.  Vascular has been consulted and will see pt this AM.   PAST MEDICAL HISTORY :  He  has a past medical history of Anemia, Cardiomyopathy (HCC), CHF (congestive heart failure) (HCC), COPD (chronic obstructive pulmonary disease) (HCC), Dysrhythmia, ETOH abuse, GERD (gastroesophageal reflux disease), History of DVT of lower extremity, and History of Helicobacter pylori infection.  PAST SURGICAL HISTORY: He  has a past surgical history that includes Colonoscopy w/ polypectomy; Cardiac catheterization; Colonoscopy with propofol (N/A, 10/14/2017); and Esophagogastroduodenoscopy (egd) with propofol (N/A, 10/14/2017).  No Known Allergies  No  current facility-administered medications on file prior to encounter.    Current Outpatient Medications on File Prior to Encounter  Medication Sig  . carvedilol (COREG) 6.25 MG tablet Take 6.25 mg by mouth 2 (two) times daily with a meal.  . Cholecalciferol 1000 units tablet Take 1,000 Units by mouth daily.   . folic acid (FOLVITE) 1 MG tablet Take 1 mg by mouth daily.  . furosemide (LASIX) 20 MG tablet Take 20 mg by mouth daily.  Marland Kitchen lisinopril (PRINIVIL,ZESTRIL) 2.5 MG tablet Take 2.5 mg by mouth daily.  Marland Kitchen omeprazole (PRILOSEC) 40 MG capsule Take 40 mg by mouth daily.  . sildenafil (REVATIO) 20 MG tablet Take 20-100 mg by mouth daily as needed.   . simvastatin (ZOCOR) 40 MG tablet Take 40 mg by mouth daily.  . vitamin B-12 (CYANOCOBALAMIN) 1000 MCG tablet Take 1,000 mcg by mouth daily.  Marland Kitchen warfarin (COUMADIN) 5 MG tablet Take 5 mg by mouth daily.    FAMILY HISTORY:  His has no family status information on file.    SOCIAL HISTORY: He  reports that he quit smoking about 6 years ago. His smokeless tobacco use includes chew. He reports that he drinks about 1.2 oz of alcohol per week. He reports that he has current or past drug history.  REVIEW OF SYSTEMS:  Unable to obtain as pt is encephalopathic.  SUBJECTIVE:  On vent, sedated.  VITAL SIGNS: BP 110/70   Pulse 75   Resp 18   SpO2 100%   HEMODYNAMICS:    VENTILATOR  SETTINGS:    INTAKE / OUTPUT: No intake/output data recorded.   PHYSICAL EXAMINATION: General: Adult male, resting in bed, in NAD. Neuro: Sedated, unresponsive but does withdraw LLE and LUE to pain. HEENT: [place]/AT. Sclerae anicteric.  ETT in place. Cardiovascular: RRR, no M/R/G.  Lungs: Respirations even and unlabored.  CTA bilaterally, No W/R/R. Abdomen: BS x 4, soft, NT/ND.  Musculoskeletal: No gross deformities, no edema. RLE cool to touch, no DP or PT pulses noted. Skin: Intact, warm, no rashes.  Cool RLE as noted above.    LABS:  BMET Recent Labs   Lab 10/28/17 2315  NA 137  K 3.5  CL 105  CO2 23  BUN 17  CREATININE 1.25*  GLUCOSE 106*    Electrolytes Recent Labs  Lab 10/28/17 2315  CALCIUM 8.7*    CBC Recent Labs  Lab 10/28/17 2315  WBC 6.3  HGB 13.5  HCT 39.1*  PLT 232    Coag's Recent Labs  Lab 10/28/17 2315  APTT 42*  INR 2.61    Sepsis Markers No results for input(s): LATICACIDVEN, PROCALCITON, O2SATVEN in the last 168 hours.  ABG No results for input(s): PHART, PCO2ART, PO2ART in the last 168 hours.  Liver Enzymes Recent Labs  Lab 10/28/17 2315  AST 21  ALT 13  ALKPHOS 58  BILITOT 0.8  ALBUMIN 4.0    Cardiac Enzymes Recent Labs  Lab 10/28/17 2315  TROPONINI <0.03    Glucose Recent Labs  Lab 10/28/17 2313  GLUCAP 102*    Imaging Ct Angio Head W Or Wo Contrast  Result Date: 11/18/2017 CLINICAL DATA:  Initial evaluation for EXAM: CT ANGIOGRAPHY HEAD AND NECK CT PERFUSION BRAIN TECHNIQUE: Multidetector CT imaging of the head and neck was performed using the standard protocol during bolus administration of intravenous contrast. Multiplanar CT image reconstructions and MIPs were obtained to evaluate the vascular anatomy. Carotid stenosis measurements (when applicable) are obtained utilizing NASCET criteria, using the distal internal carotid diameter as the denominator. Multiphase CT imaging of the brain was performed following IV bolus contrast injection. Subsequent parametric perfusion maps were calculated using RAPID software. CONTRAST:  100mL OMNIPAQUE IOHEXOL 350 MG/ML SOLN COMPARISON:  Prior head CT from earlier the same day. FINDINGS: CTA NECK FINDINGS Aortic arch: Visualized aortic arch of normal caliber with normal 3 vessel morphology. Atheromatous plaque about the use arch and origin of the great vessels without hemodynamically significant stenosis. Visualized subclavian arteries widely patent Right carotid system: Scattered atheromatous plaque within the right common carotid  artery without stenosis. Atherosclerotic change about the right bifurcation/proximal right ICA without significant stenosis. Right ICA widely patent distally to the skull base without stenosis, dissection, or occlusion. Left carotid system: Left common carotid artery patent from its origin to the bifurcation without significant stenosis. Abrupt occlusion of the left ICA just distal to the bifurcation. Left ICA remains occluded within the neck. Vertebral arteries: Both of the vertebral arteries arise from the subclavian arteries. Left vertebral artery dominant. Vertebral arteries patent within the neck without stenosis, dissection, or occlusion. Skeleton: No acute osseus abnormality. No discrete lytic or blastic osseous lesions. Moderate cervical spondylolysis at C4-5 through C6-7. Other neck: No other acute abnormality within the neck. Upper chest: Visualized upper chest demonstrates no acute finding. Severe emphysema. Review of the MIP images confirms the above findings CTA HEAD FINDINGS Anterior circulation: Left ICA remains occluded to the terminus. Left M1 segment and proximal left MCA branches occluded. Scant collateral flow seen distally. Multifocal atheromatous plaque throughout the  cavernous/supraclinoid right ICA with moderate multifocal stenosis right M1 widely patent. No proximal right M2 occlusion distal right MCA branches demonstrate atheromatous irregularity but are perfused to their distal aspects. Right A1 patent. Retrograde opacification of the left A1 which appears slightly hypoplastic. Azygos ACA widely patent to its distal aspects. Posterior circulation: Vertebral arteries patent to the vertebrobasilar junction without stenosis. Left vertebral artery dominant posterior inferior cerebral arteries patent bilaterally. Basilar patent to its distal aspect without stenosis. Superior cerebral arteries patent bilaterally. Both of the PCAs patent to their distal aspects. Prominent posterior communicating  arteries noted bilaterally. Venous sinuses: Patent. Anatomic variants: None significant. Delayed phase: 5 mm focus of parenchymal enhancement along the gray-white matter differentiation of the anterior right frontal lobe noted (series 13, image 20), indeterminate, but favored to be vascular in nature. No surrounding edema or other abnormality. No other abnormal enhancement. Review of the MIP images confirms the above findings CT Brain Perfusion Findings: CBF (<30%) Volume: 40mL Perfusion (Tmax>6.0s) volume: Mismatch Volume: Infarction Location:Evidence for acute ischemic infarct involving the left MCA distribution, with involvement of the left insular region and subcortical and deep white matter of the left cerebral hemisphere. Surrounding ischemic penumbra involves much of the left cerebral hemisphere, with additional scattered perfusion abnormality within the right cerebral hemisphere. Findings are felt to be at least partially artifactual in nature. IMPRESSION: 1. Acute EVLO with occlusion of the left ICA just distal to the bifurcation. Left ICA remains occluded to the terminus with absent flow within the left MCA artery. Fairly mild collateralization seen distally within the left MCA distribution. 2. Evidence for core infarct involving the left cerebral hemisphere as above. Surrounding ischemic penumbra with perfusion mismatch volume of 483 mL, suspected to at least in part be artifactual in nature on this examination. However, a fairly substantial surrounding penumbra is still somewhat suspected. 3. Patent azygos ACA. 4. Moderate atheromatous plaque throughout the right carotid siphon with moderate multifocal stenosis. No other high-grade or hemodynamically significant stenosis identified. 5. Emphysema. Critical Value/emergent results were called by telephone at the time of interpretation on 11-08-2017 at 12:35 am to Dr. Reesa Chew , who verbally acknowledged these results. Electronically Signed    By: Rise Mu M.D.   On: 2017/11/08 00:56   Ct Angio Neck W Or Wo Contrast  Result Date: 11-08-17 CLINICAL DATA:  Initial evaluation for EXAM: CT ANGIOGRAPHY HEAD AND NECK CT PERFUSION BRAIN TECHNIQUE: Multidetector CT imaging of the head and neck was performed using the standard protocol during bolus administration of intravenous contrast. Multiplanar CT image reconstructions and MIPs were obtained to evaluate the vascular anatomy. Carotid stenosis measurements (when applicable) are obtained utilizing NASCET criteria, using the distal internal carotid diameter as the denominator. Multiphase CT imaging of the brain was performed following IV bolus contrast injection. Subsequent parametric perfusion maps were calculated using RAPID software. CONTRAST:  OMNIPAQUE IOHEXOL 350 MG/ML SOLN COMPARISON:  Prior head CT from earlier the same day. FINDINGS: CTA NECK FINDINGS Aortic arch: Visualized aortic arch of normal caliber with normal 3 vessel morphology. Atheromatous plaque about the use arch and origin of the great vessels without hemodynamically significant stenosis. Visualized subclavian arteries widely patent Right carotid system: Scattered atheromatous plaque within the right common carotid artery without stenosis. Atherosclerotic change about the right bifurcation/proximal right ICA without significant stenosis. Right ICA widely patent distally to the skull base without stenosis, dissection, or occlusion. Left carotid system: Left common carotid artery patent from its origin to the bifurcation  without significant stenosis. Abrupt occlusion of the left ICA just distal to the bifurcation. Left ICA remains occluded within the neck. Vertebral arteries: Both of the vertebral arteries arise from the subclavian arteries. Left vertebral artery dominant. Vertebral arteries patent within the neck without stenosis, dissection, or occlusion. Skeleton: No acute osseus abnormality. No discrete lytic or  blastic osseous lesions. Moderate cervical spondylolysis at C4-5 through C6-7. Other neck: No other acute abnormality within the neck. Upper chest: Visualized upper chest demonstrates no acute finding. Severe emphysema. Review of the MIP images confirms the above findings CTA HEAD FINDINGS Anterior circulation: Left ICA remains occluded to the terminus. Left M1 segment and proximal left MCA branches occluded. Scant collateral flow seen distally. Multifocal atheromatous plaque throughout the cavernous/supraclinoid right ICA with moderate multifocal stenosis right M1 widely patent. No proximal right M2 occlusion distal right MCA branches demonstrate atheromatous irregularity but are perfused to their distal aspects. Right A1 patent. Retrograde opacification of the left A1 which appears slightly hypoplastic. Azygos ACA widely patent to its distal aspects. Posterior circulation: Vertebral arteries patent to the vertebrobasilar junction without stenosis. Left vertebral artery dominant posterior inferior cerebral arteries patent bilaterally. Basilar patent to its distal aspect without stenosis. Superior cerebral arteries patent bilaterally. Both of the PCAs patent to their distal aspects. Prominent posterior communicating arteries noted bilaterally. Venous sinuses: Patent. Anatomic variants: None significant. Delayed phase: 5 mm focus of parenchymal enhancement along the gray-white matter differentiation of the anterior right frontal lobe noted (series 13, image 20), indeterminate, but favored to be vascular in nature. No surrounding edema or other abnormality. No other abnormal enhancement. Review of the MIP images confirms the above findings CT Brain Perfusion Findings: CBF (<30%) Volume: 40mL Perfusion (Tmax>6.0s) volume: Mismatch Volume: Infarction Location:Evidence for acute ischemic infarct involving the left MCA distribution, with involvement of the left insular region and subcortical and deep white  matter of the left cerebral hemisphere. Surrounding ischemic penumbra involves much of the left cerebral hemisphere, with additional scattered perfusion abnormality within the right cerebral hemisphere. Findings are felt to be at least partially artifactual in nature. IMPRESSION: 1. Acute EVLO with occlusion of the left ICA just distal to the bifurcation. Left ICA remains occluded to the terminus with absent flow within the left MCA artery. Fairly mild collateralization seen distally within the left MCA distribution. 2. Evidence for core infarct involving the left cerebral hemisphere as above. Surrounding ischemic penumbra with perfusion mismatch volume of 483 mL, suspected to at least in part be artifactual in nature on this examination. However, a fairly substantial surrounding penumbra is still somewhat suspected. 3. Patent azygos ACA. 4. Moderate atheromatous plaque throughout the right carotid siphon with moderate multifocal stenosis. No other high-grade or hemodynamically significant stenosis identified. 5. Emphysema. Critical Value/emergent results were called by telephone at the time of interpretation on 2017-11-25 at 12:35 am to Dr. Reesa Chew , who verbally acknowledged these results. Electronically Signed   By: Rise Mu M.D.   On: Nov 25, 2017 00:56   Ct Cerebral Perfusion W Contrast  Result Date: Nov 25, 2017 CLINICAL DATA:  Initial evaluation for EXAM: CT ANGIOGRAPHY HEAD AND NECK CT PERFUSION BRAIN TECHNIQUE: Multidetector CT imaging of the head and neck was performed using the standard protocol during bolus administration of intravenous contrast. Multiplanar CT image reconstructions and MIPs were obtained to evaluate the vascular anatomy. Carotid stenosis measurements (when applicable) are obtained utilizing NASCET criteria, using the distal internal carotid diameter as the denominator. Multiphase CT imaging of the brain  was performed following IV bolus contrast injection. Subsequent  parametric perfusion maps were calculated using RAPID software. CONTRAST:  OMNIPAQUE IOHEXOL 350 MG/ML SOLN COMPARISON:  Prior head CT from earlier the same day. FINDINGS: CTA NECK FINDINGS Aortic arch: Visualized aortic arch of normal caliber with normal 3 vessel morphology. Atheromatous plaque about the use arch and origin of the great vessels without hemodynamically significant stenosis. Visualized subclavian arteries widely patent Right carotid system: Scattered atheromatous plaque within the right common carotid artery without stenosis. Atherosclerotic change about the right bifurcation/proximal right ICA without significant stenosis. Right ICA widely patent distally to the skull base without stenosis, dissection, or occlusion. Left carotid system: Left common carotid artery patent from its origin to the bifurcation without significant stenosis. Abrupt occlusion of the left ICA just distal to the bifurcation. Left ICA remains occluded within the neck. Vertebral arteries: Both of the vertebral arteries arise from the subclavian arteries. Left vertebral artery dominant. Vertebral arteries patent within the neck without stenosis, dissection, or occlusion. Skeleton: No acute osseus abnormality. No discrete lytic or blastic osseous lesions. Moderate cervical spondylolysis at C4-5 through C6-7. Other neck: No other acute abnormality within the neck. Upper chest: Visualized upper chest demonstrates no acute finding. Severe emphysema. Review of the MIP images confirms the above findings CTA HEAD FINDINGS Anterior circulation: Left ICA remains occluded to the terminus. Left M1 segment and proximal left MCA branches occluded. Scant collateral flow seen distally. Multifocal atheromatous plaque throughout the cavernous/supraclinoid right ICA with moderate multifocal stenosis right M1 widely patent. No proximal right M2 occlusion distal right MCA branches demonstrate atheromatous irregularity but are perfused to their  distal aspects. Right A1 patent. Retrograde opacification of the left A1 which appears slightly hypoplastic. Azygos ACA widely patent to its distal aspects. Posterior circulation: Vertebral arteries patent to the vertebrobasilar junction without stenosis. Left vertebral artery dominant posterior inferior cerebral arteries patent bilaterally. Basilar patent to its distal aspect without stenosis. Superior cerebral arteries patent bilaterally. Both of the PCAs patent to their distal aspects. Prominent posterior communicating arteries noted bilaterally. Venous sinuses: Patent. Anatomic variants: None significant. Delayed phase: 5 mm focus of parenchymal enhancement along the gray-white matter differentiation of the anterior right frontal lobe noted (series 13, image 20), indeterminate, but favored to be vascular in nature. No surrounding edema or other abnormality. No other abnormal enhancement. Review of the MIP images confirms the above findings CT Brain Perfusion Findings: CBF (<30%) Volume: 40mL Perfusion (Tmax>6.0s) volume: Mismatch Volume: Infarction Location:Evidence for acute ischemic infarct involving the left MCA distribution, with involvement of the left insular region and subcortical and deep white matter of the left cerebral hemisphere. Surrounding ischemic penumbra involves much of the left cerebral hemisphere, with additional scattered perfusion abnormality within the right cerebral hemisphere. Findings are felt to be at least partially artifactual in nature. IMPRESSION: 1. Acute EVLO with occlusion of the left ICA just distal to the bifurcation. Left ICA remains occluded to the terminus with absent flow within the left MCA artery. Fairly mild collateralization seen distally within the left MCA distribution. 2. Evidence for core infarct involving the left cerebral hemisphere as above. Surrounding ischemic penumbra with perfusion mismatch volume of 483 mL, suspected to at least in part be  artifactual in nature on this examination. However, a fairly substantial surrounding penumbra is still somewhat suspected. 3. Patent azygos ACA. 4. Moderate atheromatous plaque throughout the right carotid siphon with moderate multifocal stenosis. No other high-grade or hemodynamically significant stenosis identified. 5. Emphysema.  Critical Value/emergent results were called by telephone at the time of interpretation on 11-25-17 at 12:35 am to Dr. Reesa Chew , who verbally acknowledged these results. Electronically Signed   By: Rise Mu M.D.   On: 11/25/2017 00:56   Dg Chest Port 1 View  Result Date: 2017-11-25 CLINICAL DATA:  Initial evaluation for acute stroke. EXAM: PORTABLE CHEST 1 VIEW COMPARISON:  None available. FINDINGS: Cardiomegaly. Mediastinal silhouette within normal limits. Aortic atherosclerosis. Lungs normally inflated. Underlying emphysematous changes. Diffusely increased vascular congestion with interstitial prominence suggests a degree of superimposed pulmonary interstitial edema. No consolidative airspace disease. No pleural effusion. No pneumothorax. No acute osseus abnormality. IMPRESSION: 1. Cardiomegaly with mild diffuse pulmonary interstitial edema/congestion. 2. Underlying emphysema. 3. Aortic atherosclerosis. Electronically Signed   By: Rise Mu M.D.   On: 11/25/17 01:16   Ct Head Code Stroke Wo Contrast  Result Date: 10/28/2017 CLINICAL DATA:  Code stroke. Initial evaluation for acute right-sided weakness. EXAM: CT HEAD WITHOUT CONTRAST TECHNIQUE: Contiguous axial images were obtained from the base of the skull through the vertex without intravenous contrast. COMPARISON:  None available. FINDINGS: Brain: Generalized age-related cerebral atrophy with chronic small vessel ischemic disease. Remote lacunar infarcts within the left basal ganglia and thalamus. There is subtle asymmetric hypodensity at the posterior left insula extending posteriorly into the  left M6 territory, with additional possible hypodensity within the left M2 territory, concerning for evolving acute ischemic left MCA territory infarct. No acute intracranial hemorrhage. No mass lesion, midline shift or mass effect. No hydrocephalus. No extra-axial fluid collection. Vascular: Asymmetric hyperdensity involving the left M1 segment extending into proximal left M2 branches, concerning for low thrombus in large vessel occlusion. Scattered vascular calcifications noted within the carotid siphons. Skull: Scalp soft tissues and calvarium within normal limits. Sinuses/Orbits: Globes and orbital soft tissues within normal limits. Paranasal sinuses and mastoids are clear. Other: None. ASPECTS [facility] Stroke Program Early CT Score) - Ganglionic level infarction (caudate, lentiform nuclei, internal capsule, insula, M1-M3 cortex): 5 - Supraganglionic infarction (M4-M6 cortex): 2 Total score (0-10 with 10 being normal): 7 IMPRESSION: 1. Asymmetric hyperdensity within the left M1 segment, concerning for large vessel occlusion. Subtle early hypodensity within the left cerebral hemisphere concerning for acute left MCA territory infarct. No acute intracranial hemorrhage. 2. ASPECTS is 7. Critical Value/emergent results were called by telephone at the time of interpretation on 10/28/2017 at 11:40 pm to Dr. Lesly Rubenstein SUNG , who verbally acknowledged these results. Electronically Signed   By: Rise Mu M.D.   On: 10/28/2017 23:42     STUDIES:  CT head / cerebral perfusion 7/31 > acute EVLO with occlusion of left ICA, absent flow in left MCA, core infarct in left cerebral hemisphere. Echo 8/1 >  MRI brain 8/1 >   CULTURES: None.  ANTIBIOTICS: None.  SIGNIFICANT EVENTS: 7/31 > admit.  LINES/TUBES: ETT 7/31 >  Arterial line 7/31 >   DISCUSSION: 77 y.o. male with hx AF on coumadin, presented to [facility] with stroke like symptoms.  Found to have evolving left MCA infarct.  Transferred to [facility] and taken  to IR where he had complete revascularization of occluded left ICA, MCA, ACA.  ASSESSMENT / PLAN:  PULMONARY A: Respiratory insufficiency - s/p intubation for IR procedure. Acute pulmonary edema. P:   Full vent support. Assess ABG and wean as able. VAP prevention measures. SBT in AM if able. 40mg  lasix x 1. CXR in AM.  CARDIOVASCULAR A:  Cold RLE without palpable pulses. Hx AF on  coumadin (INR 2.61 this admit), DVT, CHF, CM. P:  Vascular surgery consulted by primary team, to see pt this AM for possible femoral embolectomy. SBP goal < 140 per neurology - continue cleviprex PRN. F/u on echo. Anticoagulation per neurology, likely heparin gtt in lieu of preadmission coumadin if INR < 2. Continue preadmission simvastatin. Hold preadmission carvedilol, furosemide, lisinopril, sildenafil.  RENAL A:   AKI. P:   NS @ 50. BMP in AM.  GASTROINTESTINAL A:   GI prophylaxis. Nutrition. P:   SUP: Famotidine. NPO.  HEMATOLOGIC A:   On chronic anticoagulation for Afib. Hx anemia. VTE Prophylaxis. P:  Anticoagulation per neurology, likely heparin gtt in lieu of preadmission coumadin if INR < 2. Transfuse for Hgb < 7. SCD's. CBC in AM.  INFECTIOUS A:   No indication of infection. P:   Monitor clinically.  ENDOCRINE A:   No acute issues.   P:   SSI to keep glucose < 180.  NEUROLOGIC A:   Acute left MCA infarct with occlusion of left ACA and ICA - s/p IR revascularization 8/1. Sedation needs due to mechanical ventilation. Hx EtOH abuse. P:   Neurology following / managing. F/u on brain MRI. Sedation:  Propofol gtt / Fentanyl PRN. RASS goal: 0 to -1. Daily WUA. Thiamine / Folate. Assess UDS.  Family updated: None available.  Interdisciplinary Family Meeting v Palliative Care Meeting:  Due by: 11/04/17.  CC time: 35 min.   Rutherford Guys, [place] - [facility] Pager: [phone]  or [phone] 11-12-17, 5:37 AM

## 2017-10-29 NOTE — H&P (Signed)
STROKE H&P CC: RSW, APHASIA  History is obtained from: Chart, family  HPI: Hunter Adkins is a 77 y.o. male past medical history of atrial fibrillation on Coumadin, congestive heart failure COPD history of lower extremity DVT, alcohol abuse, who was in his usual state of health at a bar at a pool tournament and had sudden onset of aphasia and right-sided weakness at 2226 hrs on 10/28/2017, witnessed by family members.  He was brought in for evaluation to [facility].  Evaluated by telemedicine neurology.  Found to have mutism, left gaze preference, right hemianopsia, right-sided hemiplegia with a noncontrast CT of the head showing evolving left MCA territory stroke.  He was not a candidate for TPA because he was therapeutic INR on Coumadin.  CT Angie head and neck showed a left ICA occlusion at the origin as well as left M1 and left A1 occlusion.  He was transferred to [facility] for endovascular thrombectomy.   LKW: 2226 hrs. on 10/28/2017 tpa given?: no, on Coumadin-therapeutic INR Premorbid modified Rankin scale (mRS): 0  ROS: Unable to obtain due to aphasia  Past Medical History:  Diagnosis Date  . Anemia   . Cardiomyopathy (HCC)   . CHF (congestive heart failure) (HCC)   . COPD (chronic obstructive pulmonary disease) (HCC)   . Dysrhythmia    Atrial Fibrillation  . ETOH abuse   . GERD (gastroesophageal reflux disease)   . History of DVT of lower extremity   . History of Helicobacter pylori infection    No family history on file. Patient unable to provide due to aphasia  Social History:   reports that he quit smoking about 6 years ago. His smokeless tobacco use includes chew. He reports that he drinks about 1.2 oz of alcohol per week. He reports that he has current or past drug history.  Medications  Current Facility-Administered Medications:  .   stroke: mapping our early stages of recovery book, , Does not apply, Once, Milon DikesArora, Brodi Kari, MD .  0.9 %  sodium chloride  infusion, , Intravenous, Continuous, Milon DikesArora, Quadir Muns, MD .  acetaminophen (TYLENOL) tablet 650 mg, 650 mg, Oral, Q4H PRN **OR** acetaminophen (TYLENOL) solution 650 mg, 650 mg, Per Tube, Q4H PRN **OR** acetaminophen (TYLENOL) suppository 650 mg, 650 mg, Rectal, Q4H PRN, Milon DikesArora, Cassaundra Rasch, MD .  aspirin 325 MG tablet, , , ,  .  clopidogrel (PLAVIX) 300 MG tablet, , , ,  .  eptifibatide (INTEGRILIN) 20 MG/10ML injection, , , ,  .  fentaNYL (SUBLIMAZE) 100 MCG/2ML injection, , , ,  .  lidocaine (XYLOCAINE) 1 % (with pres) injection, , , ,  .  nitroGLYCERIN 100 MCG/ML intra-arterial injection, , , ,  .  nitroGLYCERIN 100 MCG/ML intra-arterial injection, , , ,  .  senna-docusate (Senokot-S) tablet 1 tablet, 1 tablet, Oral, QHS PRN, Milon DikesArora, Lavada Langsam, MD .  ticagrelor (BRILINTA) 90 MG tablet, , , ,  .  tirofiban (AGGRASTAT) 5-0.9 MG/100ML-% injection, , , ,   Exam: Current vital signs: There were no vitals taken for this visit. Vital signs in last 24 hours: Temp:  [97.6 F (36.4 C)-98.1 F (36.7 C)] 98.1 F (36.7 C) (08/01 0116) Pulse Rate:  [68-86] 82 (08/01 0116) Resp:  [15-26] 26 (08/01 0100) BP: (150-182)/(72-104) 182/99 (08/01 0116) SpO2:  [92 %-98 %] 98 % (08/01 0116) Weight:  [69.6 kg (153 lb 7 oz)] 69.6 kg (153 lb 7 oz) (07/31 2314) General: Patient is awake, alert in no distress HEENT: Normocephalic atraumatic Lungs: Clear  to auscultation Cardiovascular: S1-S2, irregularly irregular Abdomen: Nondistended nontender Neurological exam Next line patient is awake, alert, does not follow any commands. He is completely mute. He also has neglecting the right side of his body completely. Cranial nerves: Pupils are equal round reactive to light, he has a left gaze preference and inability to cross the midline, complete right-sided hemianopsia, right lower facial weakness. Motor exam: No spontaneous movement of the right arm or leg and minimal response to pain.  Left upper and lower extremity move  spontaneously but does not follow commands long enough to keep them above bed. Sensory exam: Sensation on the right to noxious stimulus. Coordination and gait could not be tested because of patient's aphasia and cooperation. NIHSS-[facility] NIH at [facility] 26 1a Level of Conscious.: 0 1b LOC Questions: 2 1c LOC Commands: 2 2 Best Gaze: 2 3 Visual: 2 4 Facial Palsy: 1 5a Motor Arm - left: 1 5b Motor Arm - Right: 3 6a Motor Leg - Left: 1 6b Motor Leg - Right: 4 7 Limb Ataxia: 0 8 Sensory: 2 9 Best Language: 3 10 Dysarthria: 2 11 Extinct. and Inatten.: 2 TOTAL: 27  Labs I have reviewed labs in epic and the results pertinent to this consultation are:  CBC    Component Value Date/Time   WBC 6.3 10/28/2017 2315   RBC 4.42 10/28/2017 2315   HGB 13.5 10/28/2017 2315   HGB 11.0 (L) 02/01/2012 0354   HCT 39.1 (L) 10/28/2017 2315   HCT 36.1 (L) 01/27/2012 0329   PLT 232 10/28/2017 2315   PLT 186 02/01/2012 0354   MCV 88.6 10/28/2017 2315   MCV 96 01/27/2012 0329   MCH 30.5 10/28/2017 2315   MCHC 34.4 10/28/2017 2315   RDW 13.2 10/28/2017 2315   RDW 13.8 01/27/2012 0329   LYMPHSABS 1.8 10/28/2017 2315   LYMPHSABS 2.1 01/27/2012 0329   MONOABS 0.7 10/28/2017 2315   MONOABS 0.5 01/27/2012 0329   EOSABS 0.1 10/28/2017 2315   EOSABS 0.0 01/27/2012 0329   BASOSABS 0.1 10/28/2017 2315   BASOSABS 0.0 01/27/2012 0329    CMP     Component Value Date/Time   NA 137 10/28/2017 2315   NA 143 01/27/2012 0329   K 3.5 10/28/2017 2315   K 4.5 01/27/2012 0329   CL 105 10/28/2017 2315   CL 109 (H) 01/27/2012 0329   CO2 23 10/28/2017 2315   CO2 25 01/27/2012 0329   GLUCOSE 106 (H) 10/28/2017 2315   GLUCOSE 105 (H) 01/27/2012 0329   BUN 17 10/28/2017 2315   BUN 12 01/27/2012 0329   CREATININE 1.25 (H) 10/28/2017 2315   CREATININE 1.20 01/27/2012 0329   CALCIUM 8.7 (L) 10/28/2017 2315   CALCIUM 7.8 (L) 01/27/2012 0329   PROT 7.5 10/28/2017 2315   PROT 7.6  01/26/2012 1925   ALBUMIN 4.0 10/28/2017 2315   ALBUMIN 3.7 01/26/2012 1925   AST 21 10/28/2017 2315   AST 22 01/26/2012 1925   ALT 13 10/28/2017 2315   ALT 17 01/26/2012 1925   ALKPHOS 58 10/28/2017 2315   ALKPHOS 92 01/26/2012 1925   BILITOT 0.8 10/28/2017 2315   BILITOT 0.9 01/26/2012 1925   GFRNONAA 55 (L) 10/28/2017 2315   GFRNONAA >60 01/27/2012 0329   GFRAA >60 10/28/2017 2315   GFRAA >60 01/27/2012 0329   Imaging I have reviewed the images obtained:  CT-scan of the brain-aspects 7, hyperdense left MCA CT angiogram of the head and neck suboptimal quality but shows left  ICA occlusion of the origin, left MCA and left A1 occlusion. CT perfusion study was done which was again artifact little which showed a substantiaL area at risk along with a 40 cc core.  Assessment:  77 year old man with sudden onset of complete mutism, left gaze preference, right hemianopsia and right hemiplegia consistent with a left MCA syndrome found to have a left internal carotid occlusion and left M1 and left M1 occlusion. Is within the time window for endovascular intervention and was transferred to [facility] for intervention. Not a candidate for TPA due to therapeutic INR on Coumadin. Taken straight to interventional radiology for endovascular thymectomy.  Not able at this time.  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  Unless he gets a stent during the endovascular procedure, hold antiplatelets for now -Blood pressure control, goal of SYS <220 unless revascularized endovascular, in that case systolic less than 140. -MRI/ECHO/A1C/Lipid panel. -Hyperglycemia management per SSI to maintain glucose  140-180mg /dL. -PT/OT/ST therapies and recommendations when able  CNS -Close neuro monitoring  Dysarthria Dysphagia following cerebral infarction  -NPO until cleared by speech -ST -Advance diet as tolerated -May need PEG  Hemiplegia and hemiparesis following cerebral infarction affecting right dominant side -PT, OT -PM&R consult  RESP Acute Respiratory Failure  -vent management per ICU -wean when able  CV Essential hypertension Blood pressure goes as above. Use labetalol as needed, use Cardene or Cleviprex drip as needed -TTE  Hyperlipidemia, unspecified  - Statin for goal LDL < 70  Chronic atrial fibrillation -Rate control -We will request PCCM consultation for vent management and medical management -We will hold anti-coagulation for right now until MRI is done  HEME Iron Deficiency Anemia -Monitor -transfuse for hgb < 7  ENDO -goal HgbA1c < 7  GI/GU -Gentle hydration  Fluid/Electrolyte Disorders  -Replete -Repeat labs  ID Possible Aspiration PNA -CXR -NPO -Monitor  Prophylaxis DVT: SCDs for now.  Currently therapeutic on Coumadin. GI: PPI per vent bundle Bowel: Docusate, senna  Diet: NPO until cleared by speech  Code Status: Full Code    THE FOLLOWING WERE PRESENT ON ADMISSION: CNS -  Acute Ischemic Stroke, Hemiplegia, Respiratory - Probable Aspiration Pneumonia, COPD Cardiovascular -  CHF  -- Milon Dikes, MD [facility] Pager: [phone] If 7pm to 7am, please call on call as listed on AMION.   CRITICAL CARE ATTESTATION This patient is critically ill and at significant risk of neurological worsening, death and care requires constant monitoring of vital signs, hemodynamics,respiratory and cardiac monitoring. I spent 60  minutes of neurocritical care time performing neurological assessment, discussion with family, other specialists and medical decision making of high complexityin the care of  this patient.

## 2017-10-29 NOTE — Anesthesia Preprocedure Evaluation (Signed)
Anesthesia Evaluation  Patient identified by MRN, date of birth, ID band Patient unresponsive    Reviewed: Allergy & Precautions, NPO status , Patient's Chart, lab work & pertinent test results, Unable to perform ROS - Chart review onlyPreop documentation limited or incomplete due to emergent nature of procedure.  Airway Mallampati: Intubated       Dental no notable dental hx.    Pulmonary COPD,  COPD inhaler, former smoker,    Pulmonary exam normal breath sounds clear to auscultation       Cardiovascular + Peripheral Vascular Disease and +CHF  + dysrhythmias Atrial Fibrillation  Rhythm:Irregular Rate:Normal  Left Femoral embolism Hx/o Cardiomyopathy   Neuro/Psych PSYCHIATRIC DISORDERS CVA, Residual Symptoms    GI/Hepatic Neg liver ROS, GERD  Medicated and Controlled,  Endo/Other  Hyperlipidemia  Renal/GU negative Renal ROS  negative genitourinary   Musculoskeletal negative musculoskeletal ROS (+)   Abdominal   Peds  Hematology  (+) anemia ,   Anesthesia Other Findings   Reproductive/Obstetrics                             Anesthesia Physical Anesthesia Plan  ASA: IV  Anesthesia Plan: General   Post-op Pain Management:    Induction: Intravenous  PONV Risk Score and Plan: 3  Airway Management Planned:   Additional Equipment:   Intra-op Plan:   Post-operative Plan: Post-operative intubation/ventilation  Informed Consent: I have reviewed the patients History and Physical, chart, labs and discussed the procedure including the risks, benefits and alternatives for the proposed anesthesia with the patient or authorized representative who has indicated his/her understanding and acceptance.     Plan Discussed with: CRNA and Surgeon  Anesthesia Plan Comments:         Anesthesia Quick Evaluation

## 2017-10-29 NOTE — Progress Notes (Signed)
Post IR.  TICI 2a EVT  Also, no palpable right sided LE pulses, since pre procedure. Dr. Corliss Skainseveshwar contacted vascular surgery, and Dr. Darrick PennaFields will see the patient in the ICU. He will remain intubated. Vascular surgery recommends 4 units FFP to reverse coumadin. Might need femoral embolectomy.  Will order FFP to be prepped in case he needs OR. Plan relayed to Charge RN in the ICU. Will consult PCCM.  -- Milon DikesAshish Aymee Fomby, MD [facility] Pager: [phone] If 7pm to 7am, please call on call as listed on AMION.

## 2017-10-29 NOTE — Anesthesia Preprocedure Evaluation (Addendum)
Anesthesia Evaluation  Patient identified by MRN, date of birth, ID band Patient unresponsive  Preop documentation limited or incomplete due to emergent nature of procedure.  Airway Mallampati: II  TM Distance: >3 FB     Dental   Pulmonary COPD, former smoker,    breath sounds clear to auscultation       Cardiovascular +CHF  + dysrhythmias Atrial Fibrillation  Rhythm:Regular Rate:Normal     Neuro/Psych    GI/Hepatic Neg liver ROS, GERD  ,  Endo/Other  negative endocrine ROS  Renal/GU negative Renal ROS     Musculoskeletal   Abdominal   Peds  Hematology   Anesthesia Other Findings   Reproductive/Obstetrics                            Anesthesia Physical Anesthesia Plan  ASA: IV  Anesthesia Plan: General   Post-op Pain Management:    Induction: Intravenous  PONV Risk Score and Plan: Treatment may vary due to age or medical condition  Airway Management Planned: Oral ETT  Additional Equipment:   Intra-op Plan:   Post-operative Plan: Possible Post-op intubation/ventilation  Informed Consent: I have reviewed the patients History and Physical, chart, labs and discussed the procedure including the risks, benefits and alternatives for the proposed anesthesia with the patient or authorized representative who has indicated his/her understanding and acceptance.     Plan Discussed with: CRNA, Anesthesiologist and Surgeon  Anesthesia Plan Comments:         Anesthesia Quick Evaluation

## 2017-10-29 NOTE — Anesthesia Procedure Notes (Signed)
Procedure Name: Intubation Date/Time: 11/12/2017 2:18 AM Performed by: Claris Che, CRNA Pre-anesthesia Checklist: Patient identified, Emergency Drugs available, Suction available, Patient being monitored and Timeout performed Patient Re-evaluated:Patient Re-evaluated prior to induction Oxygen Delivery Method: Circle system utilized Preoxygenation: Pre-oxygenation with 100% oxygen Induction Type: IV induction, Rapid sequence and Cricoid Pressure applied Laryngoscope Size: Mac and 3 Grade View: Grade II Tube type: Subglottic suction tube Tube size: 8.0 mm Number of attempts: 1 Airway Equipment and Method: Stylet Placement Confirmation: ETT inserted through vocal cords under direct vision,  positive ETCO2 and breath sounds checked- equal and bilateral Secured at: 24 cm Tube secured with: Tape Dental Injury: Teeth and Oropharynx as per pre-operative assessment

## 2017-10-29 NOTE — Progress Notes (Signed)
Chaplain received a page for a code STROKE to the patient's room. Chaplain maintained pastoral presence both inside and outside patient's room. Daughter-in-law was at the bedside answering questions about what happened. Patient was said to be actively having a massive stroke causing dysphasia and weakness on his right side. Patient taken to CT for perfusion study to determine extent of stroke. TPA could not be administered because patient had alcohol in his system and is currently on Coumadin. Family awaiting results of perfusion study and news of whether he will be transferred. Family visiting with one another in waiting room. Chaplain asked unit secretary to page if needed.     08/09/2017 0000  Clinical Encounter Type  Visited With Family  Visit Type Code

## 2017-10-29 NOTE — Progress Notes (Signed)
  Echocardiogram 2D Echocardiogram has been performed.  Belva ChimesWendy  Alayja Armas 11/07/2017, 11:26 AM

## 2017-10-29 NOTE — Progress Notes (Signed)
Patient ID: Hunter Adkins, male   DOB: [birth year], 77 y.o.   MRN: [id number] INR. Post procedure dyna CT brain reveals homogeneous hyperdensity in the caudate head and body ,putamen and lt post frontal subcortical region probably representing contrast stain with possible petechial hemorrhages.. Also distal pulses in the RT foot remain absent ,with dopplerable ones on the left,unchanged from prior to the procedure. Lt foot feels cooler without skin mottling.Marland Kitchen. Pelvic arteriogram reveals occluded  RT common femoral A with modest distal reconstitution. Have requested Vascular surgery to evaluate. S.Nolie Bignell MD

## 2017-10-30 ENCOUNTER — Inpatient Hospital Stay (HOSPITAL_COMMUNITY): Payer: PPO

## 2017-10-30 ENCOUNTER — Encounter (HOSPITAL_COMMUNITY): Payer: Self-pay | Admitting: Interventional Radiology

## 2017-10-30 DIAGNOSIS — I743 Embolism and thrombosis of arteries of the lower extremities: Secondary | ICD-10-CM

## 2017-10-30 DIAGNOSIS — R1312 Dysphagia, oropharyngeal phase: Secondary | ICD-10-CM

## 2017-10-30 LAB — BPAM FFP
BLOOD PRODUCT EXPIRATION DATE: 201908042359
Blood Product Expiration Date: 201908042359
Blood Product Expiration Date: 201908042359
Blood Product Expiration Date: 201908042359
ISSUE DATE / TIME: 201908010712
ISSUE DATE / TIME: 201908010712
ISSUE DATE / TIME: 201908010712
ISSUE DATE / TIME: 201908010712
UNIT TYPE AND RH: 6200
UNIT TYPE AND RH: 6200
Unit Type and Rh: 6200
Unit Type and Rh: 6200

## 2017-10-30 LAB — CBC WITH DIFFERENTIAL/PLATELET
Abs Immature Granulocytes: 0 10*3/uL (ref 0.0–0.1)
BASOS ABS: 0 10*3/uL (ref 0.0–0.1)
BASOS PCT: 0 %
EOS ABS: 0 10*3/uL (ref 0.0–0.7)
Eosinophils Relative: 1 %
HCT: 23.7 % — ABNORMAL LOW (ref 39.0–52.0)
Hemoglobin: 7.8 g/dL — ABNORMAL LOW (ref 13.0–17.0)
IMMATURE GRANULOCYTES: 0 %
Lymphocytes Relative: 17 %
Lymphs Abs: 1.4 10*3/uL (ref 0.7–4.0)
MCH: 30.5 pg (ref 26.0–34.0)
MCHC: 32.9 g/dL (ref 30.0–36.0)
MCV: 92.6 fL (ref 78.0–100.0)
MONOS PCT: 12 %
Monocytes Absolute: 1.1 10*3/uL — ABNORMAL HIGH (ref 0.1–1.0)
NEUTROS ABS: 6.1 10*3/uL (ref 1.7–7.7)
Neutrophils Relative %: 70 %
PLATELETS: 185 10*3/uL (ref 150–400)
RBC: 2.56 MIL/uL — ABNORMAL LOW (ref 4.22–5.81)
RDW: 13.2 % (ref 11.5–15.5)
WBC: 8.7 10*3/uL (ref 4.0–10.5)

## 2017-10-30 LAB — PREPARE FRESH FROZEN PLASMA
UNIT DIVISION: 0
UNIT DIVISION: 0
Unit division: 0
Unit division: 0

## 2017-10-30 LAB — COMPREHENSIVE METABOLIC PANEL
ALBUMIN: 2.8 g/dL — AB (ref 3.5–5.0)
ALT: 11 U/L (ref 0–44)
AST: 19 U/L (ref 15–41)
Alkaline Phosphatase: 42 U/L (ref 38–126)
Anion gap: 7 (ref 5–15)
BUN: 10 mg/dL (ref 8–23)
CHLORIDE: 112 mmol/L — AB (ref 98–111)
CO2: 23 mmol/L (ref 22–32)
Calcium: 7.8 mg/dL — ABNORMAL LOW (ref 8.9–10.3)
Creatinine, Ser: 1.22 mg/dL (ref 0.61–1.24)
GFR calc Af Amer: >60 mL/min (ref >60)
GFR calc non Af Amer: 56 mL/min — ABNORMAL LOW (ref >60)
GLUCOSE: 102 mg/dL — AB (ref 70–99)
Potassium: 3.4 mmol/L — ABNORMAL LOW (ref 3.5–5.1)
SODIUM: 142 mmol/L (ref 135–145)
Total Bilirubin: 0.8 mg/dL (ref 0.3–1.2)
Total Protein: 5.2 g/dL — ABNORMAL LOW (ref 6.5–8.1)

## 2017-10-30 LAB — BLOOD GAS, ARTERIAL
Acid-base deficit: 0.9 mmol/L (ref 0.0–2.0)
Bicarbonate: 23 mmol/L (ref 20.0–28.0)
DRAWN BY: 414221
FIO2: 30
MECHVT: 510 mL
O2 Saturation: 98.1 %
PEEP: 5 cmH2O
PO2 ART: 103 mmHg (ref 83.0–108.0)
Patient temperature: 98.6
RATE: 16 resp/min
pCO2 arterial: 36.4 mmHg (ref 32.0–48.0)
pH, Arterial: 7.417 (ref 7.350–7.450)

## 2017-10-30 LAB — PROTIME-INR
INR: 2.29
Prothrombin Time: 25 seconds — ABNORMAL HIGH (ref 11.4–15.2)

## 2017-10-30 LAB — CK: CK TOTAL: 55 U/L (ref 49–397)

## 2017-10-30 LAB — MAGNESIUM: MAGNESIUM: 1.8 mg/dL (ref 1.7–2.4)

## 2017-10-30 LAB — LACTIC ACID, PLASMA: Lactic Acid, Venous: 1 mmol/L (ref 0.5–1.9)

## 2017-10-30 LAB — PHOSPHORUS: PHOSPHORUS: 2.5 mg/dL (ref 2.5–4.6)

## 2017-10-30 MED ORDER — VITAL AF 1.2 CAL PO LIQD
1000.0000 mL | ORAL | Status: DC
  Administered 2017-10-30 – 2017-10-31 (×2): 1000 mL

## 2017-10-30 MED ORDER — MAGNESIUM SULFATE 2 GM/50ML IV SOLN
2.0000 g | Freq: Once | INTRAVENOUS | Status: AC
  Administered 2017-10-30: 2 g via INTRAVENOUS
  Filled 2017-10-30: qty 50

## 2017-10-30 MED ORDER — PRO-STAT SUGAR FREE PO LIQD
30.0000 mL | Freq: Every day | ORAL | Status: DC
  Administered 2017-10-30 – 2017-11-01 (×3): 30 mL
  Filled 2017-10-30 (×2): qty 30

## 2017-10-30 MED ORDER — POTASSIUM PHOSPHATES 15 MMOLE/5ML IV SOLN
30.0000 mmol | Freq: Once | INTRAVENOUS | Status: DC
  Filled 2017-10-30 (×2): qty 10

## 2017-10-30 MED ORDER — ALBUTEROL SULFATE (2.5 MG/3ML) 0.083% IN NEBU
2.5000 mg | INHALATION_SOLUTION | Freq: Four times a day (QID) | RESPIRATORY_TRACT | Status: DC | PRN
Start: 2017-10-30 — End: 2017-11-02

## 2017-10-30 MED ORDER — VITAL HIGH PROTEIN PO LIQD: 1000.0000 mL | ORAL | Status: DC

## 2017-10-30 MED ORDER — PRO-STAT SUGAR FREE PO LIQD
30.0000 mL | Freq: Two times a day (BID) | ORAL | Status: DC
  Filled 2017-10-30: qty 30

## 2017-10-30 NOTE — Progress Notes (Signed)
STROKE TEAM PROGRESS NOTE   SUBJECTIVE (INTERVAL HISTORY) His RN is at the bedside.  Patient is still intubated and sedated. On weaning trials. As per RN, pt did not open eyes or following commands when propofol was off. Still has right hemiplegia. Right leg fasciotomies are stable. On wound VAC.    OBJECTIVE Temp:  [93.6 F (34.2 C)-100.4 F (38 C)] 97.9 F (36.6 C) (08/02 0900) Pulse Rate:  [30-128] 61 (08/02 0900) Cardiac Rhythm: Atrial fibrillation (08/02 0800) Resp:  [11-25] 15 (08/02 0900) BP: (105-150)/(48-109) 132/64 (08/02 0830) SpO2:  [97 %-100 %] 100 % (08/02 0900) Arterial Line BP: (119-156)/(38-78) 136/61 (08/02 0900) FiO2 (%):  [30 %-100 %] 30 % (08/02 0800)  Recent Labs  Lab 10/28/17 2313  GLUCAP 102*   Recent Labs  Lab 10/28/17 2315 11/05/2017 0807 10/30/17 0620  NA 137 138 142  K 3.5 4.2 3.4*  CL 105  --  112*  CO2 23  --  23  GLUCOSE 106*  --  102*  BUN 17  --  10  CREATININE 1.25*  --  1.22  CALCIUM 8.7*  --  7.8*  MG  --   --  1.8  PHOS  --   --  2.5   Recent Labs  Lab 10/28/17 2315 10/30/17 0620  AST 21 19  ALT 13 11  ALKPHOS 58 42  BILITOT 0.8 0.8  PROT 7.5 5.2*  ALBUMIN 4.0 2.8*   Recent Labs  Lab 10/28/17 2315 11/28/2017 0807 11/10/2017 1305 11/15/2017 2034 10/30/17 0620  WBC 6.3  --   --  9.0 8.7  NEUTROABS 3.6  --   --   --  6.1  HGB 13.5 10.5* 8.9* 8.2* 7.8*  HCT 39.1* 31.0* 26.5* 24.4* 23.7*  MCV 88.6  --   --  92.1 92.6  PLT 232  --   --  185 185   Recent Labs  Lab 10/28/17 2315 10/30/17 0620  CKTOTAL  --  55  TROPONINI <0.03  --    Recent Labs    10/28/17 2315 10/30/17 0620  LABPROT 27.7* 25.0*  INR 2.61 2.29   No results for input(s): COLORURINE, LABSPEC, PHURINE, GLUCOSEU, HGBUR, BILIRUBINUR, KETONESUR, PROTEINUR, UROBILINOGEN, NITRITE, LEUKOCYTESUR in the last 72 hours.  Invalid input(s): APPERANCEUR     Component Value Date/Time   CHOL 117 11/24/2017 0500   CHOL 126 01/27/2012 0329   TRIG 70 11/13/2017  0537   TRIG 64 01/27/2012 0329   HDL 37 (L) 11/13/2017 0500   HDL 43 01/27/2012 0329   CHOLHDL 3.2 11/28/2017 0500   VLDL 13 11/18/2017 0500   VLDL 13 01/27/2012 0329   LDLCALC 67 11/11/2017 0500   LDLCALC 70 01/27/2012 0329   Lab Results  Component Value Date   HGBA1C 5.8 (H) 11/15/2017      Component Value Date/Time   LABOPIA NONE DETECTED 11/25/2017 0730   COCAINSCRNUR NONE DETECTED 11/08/2017 0730   LABBENZ NONE DETECTED 10/30/2017 0730   AMPHETMU NONE DETECTED 11/22/2017 0730   THCU NONE DETECTED 11/13/2017 0730   LABBARB NONE DETECTED 11/04/2017 0730    Recent Labs  Lab 10/28/17 2315  ETH <10    I have personally reviewed the radiological images below and agree with the radiology interpretations.  Ct Angio Head W Or Wo Contrast  Result Date: 11/08/2017 CLINICAL DATA:  Initial evaluation for EXAM: CT ANGIOGRAPHY HEAD AND NECK CT PERFUSION BRAIN TECHNIQUE: Multidetector CT imaging of the head and neck was performed using the standard protocol  during bolus administration of intravenous contrast. Multiplanar CT image reconstructions and MIPs were obtained to evaluate the vascular anatomy. Carotid stenosis measurements (when applicable) are obtained utilizing NASCET criteria, using the distal internal carotid diameter as the denominator. Multiphase CT imaging of the brain was performed following IV bolus contrast injection. Subsequent parametric perfusion maps were calculated using RAPID software. CONTRAST:  OMNIPAQUE IOHEXOL 350 MG/ML SOLN COMPARISON:  Prior head CT from earlier the same day. FINDINGS: CTA NECK FINDINGS Aortic arch: Visualized aortic arch of normal caliber with normal 3 vessel morphology. Atheromatous plaque about the use arch and origin of the great vessels without hemodynamically significant stenosis. Visualized subclavian arteries widely patent Right carotid system: Scattered atheromatous plaque within the right common carotid artery without stenosis.  Atherosclerotic change about the right bifurcation/proximal right ICA without significant stenosis. Right ICA widely patent distally to the skull base without stenosis, dissection, or occlusion. Left carotid system: Left common carotid artery patent from its origin to the bifurcation without significant stenosis. Abrupt occlusion of the left ICA just distal to the bifurcation. Left ICA remains occluded within the neck. Vertebral arteries: Both of the vertebral arteries arise from the subclavian arteries. Left vertebral artery dominant. Vertebral arteries patent within the neck without stenosis, dissection, or occlusion. Skeleton: No acute osseus abnormality. No discrete lytic or blastic osseous lesions. Moderate cervical spondylolysis at C4-5 through C6-7. Other neck: No other acute abnormality within the neck. Upper chest: Visualized upper chest demonstrates no acute finding. Severe emphysema. Review of the MIP images confirms the above findings CTA HEAD FINDINGS Anterior circulation: Left ICA remains occluded to the terminus. Left M1 segment and proximal left MCA branches occluded. Scant collateral flow seen distally. Multifocal atheromatous plaque throughout the cavernous/supraclinoid right ICA with moderate multifocal stenosis right M1 widely patent. No proximal right M2 occlusion distal right MCA branches demonstrate atheromatous irregularity but are perfused to their distal aspects. Right A1 patent. Retrograde opacification of the left A1 which appears slightly hypoplastic. Azygos ACA widely patent to its distal aspects. Posterior circulation: Vertebral arteries patent to the vertebrobasilar junction without stenosis. Left vertebral artery dominant posterior inferior cerebral arteries patent bilaterally. Basilar patent to its distal aspect without stenosis. Superior cerebral arteries patent bilaterally. Both of the PCAs patent to their distal aspects. Prominent posterior communicating arteries noted  bilaterally. Venous sinuses: Patent. Anatomic variants: None significant. Delayed phase: 5 mm focus of parenchymal enhancement along the gray-white matter differentiation of the anterior right frontal lobe noted (series 13, image 20), indeterminate, but favored to be vascular in nature. No surrounding edema or other abnormality. No other abnormal enhancement. Review of the MIP images confirms the above findings CT Brain Perfusion Findings: CBF (<30%) Volume: 40mL Perfusion (Tmax>6.0s) volume: Mismatch Volume: Infarction Location:Evidence for acute ischemic infarct involving the left MCA distribution, with involvement of the left insular region and subcortical and deep white matter of the left cerebral hemisphere. Surrounding ischemic penumbra involves much of the left cerebral hemisphere, with additional scattered perfusion abnormality within the right cerebral hemisphere. Findings are felt to be at least partially artifactual in nature. IMPRESSION: 1. Acute EVLO with occlusion of the left ICA just distal to the bifurcation. Left ICA remains occluded to the terminus with absent flow within the left MCA artery. Fairly mild collateralization seen distally within the left MCA distribution. 2. Evidence for core infarct involving the left cerebral hemisphere as above. Surrounding ischemic penumbra with perfusion mismatch volume of 483 mL, suspected to at least in part be  artifactual in nature on this examination. However, a fairly substantial surrounding penumbra is still somewhat suspected. 3. Patent azygos ACA. 4. Moderate atheromatous plaque throughout the right carotid siphon with moderate multifocal stenosis. No other high-grade or hemodynamically significant stenosis identified. 5. Emphysema. Critical Value/emergent results were called by telephone at the time of interpretation on 11/24/2017 at 12:35 am to Dr. Reesa ChewJASON SEBESTO , who verbally acknowledged these results. Electronically Signed   By: Rise MuBenjamin   McClintock M.D.   On: 11/09/2017 00:56   Ct Angio Neck W Or Wo Contrast  Result Date: 11/28/2017 CLINICAL DATA:  Initial evaluation for EXAM: CT ANGIOGRAPHY HEAD AND NECK CT PERFUSION BRAIN TECHNIQUE: Multidetector CT imaging of the head and neck was performed using the standard protocol during bolus administration of intravenous contrast. Multiplanar CT image reconstructions and MIPs were obtained to evaluate the vascular anatomy. Carotid stenosis measurements (when applicable) are obtained utilizing NASCET criteria, using the distal internal carotid diameter as the denominator. Multiphase CT imaging of the brain was performed following IV bolus contrast injection. Subsequent parametric perfusion maps were calculated using RAPID software. CONTRAST:  100mL OMNIPAQUE IOHEXOL 350 MG/ML SOLN COMPARISON:  Prior head CT from earlier the same day. FINDINGS: CTA NECK FINDINGS Aortic arch: Visualized aortic arch of normal caliber with normal 3 vessel morphology. Atheromatous plaque about the use arch and origin of the great vessels without hemodynamically significant stenosis. Visualized subclavian arteries widely patent Right carotid system: Scattered atheromatous plaque within the right common carotid artery without stenosis. Atherosclerotic change about the right bifurcation/proximal right ICA without significant stenosis. Right ICA widely patent distally to the skull base without stenosis, dissection, or occlusion. Left carotid system: Left common carotid artery patent from its origin to the bifurcation without significant stenosis. Abrupt occlusion of the left ICA just distal to the bifurcation. Left ICA remains occluded within the neck. Vertebral arteries: Both of the vertebral arteries arise from the subclavian arteries. Left vertebral artery dominant. Vertebral arteries patent within the neck without stenosis, dissection, or occlusion. Skeleton: No acute osseus abnormality. No discrete lytic or blastic osseous  lesions. Moderate cervical spondylolysis at C4-5 through C6-7. Other neck: No other acute abnormality within the neck. Upper chest: Visualized upper chest demonstrates no acute finding. Severe emphysema. Review of the MIP images confirms the above findings CTA HEAD FINDINGS Anterior circulation: Left ICA remains occluded to the terminus. Left M1 segment and proximal left MCA branches occluded. Scant collateral flow seen distally. Multifocal atheromatous plaque throughout the cavernous/supraclinoid right ICA with moderate multifocal stenosis right M1 widely patent. No proximal right M2 occlusion distal right MCA branches demonstrate atheromatous irregularity but are perfused to their distal aspects. Right A1 patent. Retrograde opacification of the left A1 which appears slightly hypoplastic. Azygos ACA widely patent to its distal aspects. Posterior circulation: Vertebral arteries patent to the vertebrobasilar junction without stenosis. Left vertebral artery dominant posterior inferior cerebral arteries patent bilaterally. Basilar patent to its distal aspect without stenosis. Superior cerebral arteries patent bilaterally. Both of the PCAs patent to their distal aspects. Prominent posterior communicating arteries noted bilaterally. Venous sinuses: Patent. Anatomic variants: None significant. Delayed phase: 5 mm focus of parenchymal enhancement along the gray-white matter differentiation of the anterior right frontal lobe noted (series 13, image 20), indeterminate, but favored to be vascular in nature. No surrounding edema or other abnormality. No other abnormal enhancement. Review of the MIP images confirms the above findings CT Brain Perfusion Findings: CBF (<30%) Volume: 40mL Perfusion (Tmax>6.0s) volume: 523mL Mismatch Volume: 483mL Infarction Location:Evidence  for acute ischemic infarct involving the left MCA distribution, with involvement of the left insular region and subcortical and deep white matter of the left  cerebral hemisphere. Surrounding ischemic penumbra involves much of the left cerebral hemisphere, with additional scattered perfusion abnormality within the right cerebral hemisphere. Findings are felt to be at least partially artifactual in nature. IMPRESSION: 1. Acute EVLO with occlusion of the left ICA just distal to the bifurcation. Left ICA remains occluded to the terminus with absent flow within the left MCA artery. Fairly mild collateralization seen distally within the left MCA distribution. 2. Evidence for core infarct involving the left cerebral hemisphere as above. Surrounding ischemic penumbra with perfusion mismatch volume of 483 mL, suspected to at least in part be artifactual in nature on this examination. However, a fairly substantial surrounding penumbra is still somewhat suspected. 3. Patent azygos ACA. 4. Moderate atheromatous plaque throughout the right carotid siphon with moderate multifocal stenosis. No other high-grade or hemodynamically significant stenosis identified. 5. Emphysema. Critical Value/emergent results were called by telephone at the time of interpretation on 10/31/2017 at 12:35 am to Dr. Reesa Chew , who verbally acknowledged these results. Electronically Signed   By: Rise Mu M.D.   On: 11/17/2017 00:56   Ct Cerebral Perfusion W Contrast  Result Date: 11/08/2017 CLINICAL DATA:  Initial evaluation for EXAM: CT ANGIOGRAPHY HEAD AND NECK CT PERFUSION BRAIN TECHNIQUE: Multidetector CT imaging of the head and neck was performed using the standard protocol during bolus administration of intravenous contrast. Multiplanar CT image reconstructions and MIPs were obtained to evaluate the vascular anatomy. Carotid stenosis measurements (when applicable) are obtained utilizing NASCET criteria, using the distal internal carotid diameter as the denominator. Multiphase CT imaging of the brain was performed following IV bolus contrast injection. Subsequent parametric perfusion  maps were calculated using RAPID software. CONTRAST:  OMNIPAQUE IOHEXOL 350 MG/ML SOLN COMPARISON:  Prior head CT from earlier the same day. FINDINGS: CTA NECK FINDINGS Aortic arch: Visualized aortic arch of normal caliber with normal 3 vessel morphology. Atheromatous plaque about the use arch and origin of the great vessels without hemodynamically significant stenosis. Visualized subclavian arteries widely patent Right carotid system: Scattered atheromatous plaque within the right common carotid artery without stenosis. Atherosclerotic change about the right bifurcation/proximal right ICA without significant stenosis. Right ICA widely patent distally to the skull base without stenosis, dissection, or occlusion. Left carotid system: Left common carotid artery patent from its origin to the bifurcation without significant stenosis. Abrupt occlusion of the left ICA just distal to the bifurcation. Left ICA remains occluded within the neck. Vertebral arteries: Both of the vertebral arteries arise from the subclavian arteries. Left vertebral artery dominant. Vertebral arteries patent within the neck without stenosis, dissection, or occlusion. Skeleton: No acute osseus abnormality. No discrete lytic or blastic osseous lesions. Moderate cervical spondylolysis at C4-5 through C6-7. Other neck: No other acute abnormality within the neck. Upper chest: Visualized upper chest demonstrates no acute finding. Severe emphysema. Review of the MIP images confirms the above findings CTA HEAD FINDINGS Anterior circulation: Left ICA remains occluded to the terminus. Left M1 segment and proximal left MCA branches occluded. Scant collateral flow seen distally. Multifocal atheromatous plaque throughout the cavernous/supraclinoid right ICA with moderate multifocal stenosis right M1 widely patent. No proximal right M2 occlusion distal right MCA branches demonstrate atheromatous irregularity but are perfused to their distal aspects.  Right A1 patent. Retrograde opacification of the left A1 which appears slightly hypoplastic. Azygos ACA widely patent to its  distal aspects. Posterior circulation: Vertebral arteries patent to the vertebrobasilar junction without stenosis. Left vertebral artery dominant posterior inferior cerebral arteries patent bilaterally. Basilar patent to its distal aspect without stenosis. Superior cerebral arteries patent bilaterally. Both of the PCAs patent to their distal aspects. Prominent posterior communicating arteries noted bilaterally. Venous sinuses: Patent. Anatomic variants: None significant. Delayed phase: 5 mm focus of parenchymal enhancement along the gray-white matter differentiation of the anterior right frontal lobe noted (series 13, image 20), indeterminate, but favored to be vascular in nature. No surrounding edema or other abnormality. No other abnormal enhancement. Review of the MIP images confirms the above findings CT Brain Perfusion Findings: CBF (<30%) Volume: 40mL Perfusion (Tmax>6.0s) volume: Mismatch Volume: Infarction Location:Evidence for acute ischemic infarct involving the left MCA distribution, with involvement of the left insular region and subcortical and deep white matter of the left cerebral hemisphere. Surrounding ischemic penumbra involves much of the left cerebral hemisphere, with additional scattered perfusion abnormality within the right cerebral hemisphere. Findings are felt to be at least partially artifactual in nature. IMPRESSION: 1. Acute EVLO with occlusion of the left ICA just distal to the bifurcation. Left ICA remains occluded to the terminus with absent flow within the left MCA artery. Fairly mild collateralization seen distally within the left MCA distribution. 2. Evidence for core infarct involving the left cerebral hemisphere as above. Surrounding ischemic penumbra with perfusion mismatch volume of 483 mL, suspected to at least in part be artifactual in nature  on this examination. However, a fairly substantial surrounding penumbra is still somewhat suspected. 3. Patent azygos ACA. 4. Moderate atheromatous plaque throughout the right carotid siphon with moderate multifocal stenosis. No other high-grade or hemodynamically significant stenosis identified. 5. Emphysema. Critical Value/emergent results were called by telephone at the time of interpretation on 11-09-17 at 12:35 am to Dr. Reesa Chew , who verbally acknowledged these results. Electronically Signed   By: Rise Mu M.D.   On: Nov 09, 2017 00:56   Ct Head Code Stroke Wo Contrast  Result Date: 10/28/2017 CLINICAL DATA:  Code stroke. Initial evaluation for acute right-sided weakness. EXAM: CT HEAD WITHOUT CONTRAST TECHNIQUE: Contiguous axial images were obtained from the base of the skull through the vertex without intravenous contrast. COMPARISON:  None available. FINDINGS: Brain: Generalized age-related cerebral atrophy with chronic small vessel ischemic disease. Remote lacunar infarcts within the left basal ganglia and thalamus. There is subtle asymmetric hypodensity at the posterior left insula extending posteriorly into the left M6 territory, with additional possible hypodensity within the left M2 territory, concerning for evolving acute ischemic left MCA territory infarct. No acute intracranial hemorrhage. No mass lesion, midline shift or mass effect. No hydrocephalus. No extra-axial fluid collection. Vascular: Asymmetric hyperdensity involving the left M1 segment extending into proximal left M2 branches, concerning for low thrombus in large vessel occlusion. Scattered vascular calcifications noted within the carotid siphons. Skull: Scalp soft tissues and calvarium within normal limits. Sinuses/Orbits: Globes and orbital soft tissues within normal limits. Paranasal sinuses and mastoids are clear. Other: None. ASPECTS [facility] Stroke Program Early CT Score) - Ganglionic level infarction (caudate,  lentiform nuclei, internal capsule, insula, M1-M3 cortex): 5 - Supraganglionic infarction (M4-M6 cortex): 2 Total score (0-10 with 10 being normal): 7 IMPRESSION: 1. Asymmetric hyperdensity within the left M1 segment, concerning for large vessel occlusion. Subtle early hypodensity within the left cerebral hemisphere concerning for acute left MCA territory infarct. No acute intracranial hemorrhage. 2. ASPECTS is 7. Critical Value/emergent results were called by telephone at the  time of interpretation on 10/28/2017 at 11:40 pm to Dr. Lesly Rubenstein SUNG , who verbally acknowledged these results. Electronically Signed   By: Rise Mu M.D.   On: 10/28/2017 23:42   DSA endovascular revascularization of occluded Lt ICA distally ,Lt MCA and LT ACA with x 3 passes with tembotrap x 33mm retriever device and  X 1 pass with the 3mm x 20 mm treprovue retriever device achiving a TICI 2 b revascularization.   TTE  - Left ventricle: The cavity size was normal. Wall thickness was   normal. Systolic function was mildly to moderately reduced. The   estimated ejection fraction was in the range of 40% to 45%. - Aortic valve: nodular calcification of leaflet tips. There was   mild regurgitation. Valve area (VTI): 1.77 cm^2. Valve area   (Vmax): 2.95 cm^2. Valve area (Vmean): 2.18 cm^2. - Mitral valve: Nodular calcification of leaflet tips with   diastolic bowing moderate appearing MS. Severely calcified   annulus. Moderately thickened, moderately calcified leaflets .   There was mild regurgitation. Valve area by pressure half-time:   2.04 cm^2. Valve area by continuity equation (using LVOT flow):   0.84 cm^2. - Left atrium: The atrium was severely dilated. - Right atrium: The atrium was severely dilated. - Atrial septum: No defect or patent foramen ovale was identified. - Tricuspid valve: There was moderate regurgitation. - Pulmonary arteries: PA peak pressure: 41 mm Hg (S). - Pericardium, extracardiac: A  trivial pericardial effusion was   identified posterior to the heart.  MRI and MRA pending   PHYSICAL EXAM  Temp:  [93.6 F (34.2 C)-100.4 F (38 C)] 97.9 F (36.6 C) (08/02 0900) Pulse Rate:  [30-128] 61 (08/02 0900) Resp:  [11-25] 15 (08/02 0900) BP: (105-150)/(48-109) 132/64 (08/02 0830) SpO2:  [97 %-100 %] 100 % (08/02 0900) Arterial Line BP: (119-156)/(38-78) 136/61 (08/02 0900) FiO2 (%):  [30 %-100 %] 30 % (08/02 0800)  General - Well nourished, well developed, intubated and sedated.  Ophthalmologic - fundi not visualized due to noncooperation.  Cardiovascular - irregularly irregular heart rate and rhythm.  Neuro - intubated on sedation, not following commands, not open eyes with voice or pain stimulation.  With eyelid force opening, eye left gaze preference, PERRL, sluggish doll's eyes, not blinking to visual threat bilaterally.  Absence of corneal reflexes, weak gag and cough.  Facial asymmetry not able to tell due to ET tube.  On pain stimulation, moving left upper and lower extremities against gravities, localizing to pain.  However, right upper extremity 0/5 and right lower extremity 1/5.  DTR 1+, Babinski positive on the right. Sensation, coordination and gait not tested.    ASSESSMENT/PLAN Hunter Adkins is a 77 y.o. male with history of CHF, cardiomyopathy, A. fib on Coumadin, history of DVT, alcohol use admitted for aphasia, right-sided weakness, left gaze and right knee leg. No tPA given due to therapeutic INR.    Stroke:  Left hemisphere infarct due to left ICA occlusion status post thrombectomy, embolic secondary to A. fib  Resultant intubated and sedated with right hemiplegia  CT head left MCA hyperdense sign, left MCA territory subtle infarct  CTA head and neck left ICA, MCA, and ACA occlusion, moderate right ICA bulb and siphon atherosclerosis  MRI and MRA pending  2D Echo EF 40 to 45%  LDL 67  HgbA1c 5.8  SCDs for VTE prophylaxis  warfarin  daily prior to admission, now on No antithrombotic due to concerns of hemorrhagic conversion, recent  surgery with bloody drainage and anemia. Pending MRI to decide.  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  Had emergent right femoral embolectomy  Had right common femoral artery enterectomy with vein patch angioplasty  Had right LE 4 compartment fasciotomies  Wound VAC at right LE  VVS on board  Afib on coumadin  INR 2.61, therapeutic  May consider switch to NOAC when appropriate for stroke prevention  Pending MRI to rule out hemorrhagic conversion before anticoagulation  Acute blood loss anemia  Hemoglobin 13.5-> 8.9->8.2->7.8  Likely related to current procedure and surgeries  Close CBC monitoring  PRBC transfusion if less than 7.0  BP management Stable on the low side On neo  BP goal 120-180 now 24 h post procedure  Hyperlipidemia  Home meds: Zocor  LDL 69, goal < 70  Now on Zocor 40  Continue statin at discharge  Dysphagia   Currently intubated   NPO  Will put cortrak and start TF  Other Stroke Risk Factors  Advanced age  Former cigarette smoker quit 6 years ago  ETOH use -on folic acid and B1  Cardiomyopathy - EF 40 to 45%  History of DVT  Other Active Problems  Elevated creatinine 1.25->1.22  Hospital day # 1  This patient is critically ill due to left hemispheric stroke, 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 fib, anemia and at significant risk of neurological worsening, death form recurrent stroke, hemorrhagic conversion, right leg ischemia, heart failure, hypovolemic shock. This patient's care requires constant monitoring of vital signs, hemodynamics, respiratory and cardiac monitoring, review of multiple databases, neurological assessment,  discussion with family, other specialists and medical decision making of high complexity. I spent 45 minutes of neurocritical care time in the care of this patient.   Marvel Plan, MD PhD Stroke Neurology 10/30/2017 9:34 AM    To contact Stroke Continuity provider, please refer to [email or web address]. After hours, contact General Neurology

## 2017-10-30 NOTE — Consult Note (Signed)
WOC follow-up: Reviewed EMR WOC notes from yesterday. Called bedside nurse to discuss troubleshooting the Vac, or determine if it is still bleeding. She stated that the vascular team came to assess the wound yesterday and change the Vac, and it is not bleeding or alarming at this time.  WOC team will plan to change on Monday.  Please refer to vascular team for further questions. Cammie Mcgeeawn Claudy Abdallah MSN, RN, CWOCN, BufordWCN-AP, CNS [phone]

## 2017-10-30 NOTE — Progress Notes (Signed)
Cortrak Tube Team Note:  Consult received to place a Cortrak feeding tube.   Multiple RDs attempted to pass Cortrak feeding tube through both left and right nares but were unsuccessful. Discussed with RN, and decision was made to attempt oral placement.  A 10 F Cortrak tube was placed in the mouth and secured with tape to ET tube at 73 cm. Per the Cortrak monitor reading the tube tip is gastric, at the pylorus.   No x-ray is required. RN may begin using tube.   If the tube becomes dislodged please keep the tube and contact the Cortrak team at [email or web address] ([email or web address]) for replacement.  If after hours and replacement cannot be delayed, place a NG tube and confirm placement with an abdominal x-ray.    Kate Jablonski Ren Aspinall, MEarma ReadingS, RD, LDN Pager: [phone] Weekend/After Hours: [phone]

## 2017-10-30 NOTE — Progress Notes (Signed)
PT Cancellation Note  Patient Details Name: Hunter Adkins MRN: [id number] DOB: [birth year]   Cancelled Treatment:    Reason Eval/Treat Not Completed: Patient not medically ready.  Pt on vent and managed by pressors.  Will hold today and see if appropriate 8/3. 10/30/2017  Neshkoro BingKen [name], PT [phone]  (pager)   [name] 10/30/2017, 1:45 PM

## 2017-10-30 NOTE — Progress Notes (Addendum)
[facility]  Subjective  - Intubated and sedated on pressors.   Objective 132/64 61 97.9 F (36.6 C) 15 100%  Intake/Output Summary (Last 24 hours) at 10/30/2017 0908 Last data filed at 10/30/2017 0800 Gross per 24 hour  Intake 4506.35 ml  Output 3225 ml  Net 1281.35 ml    Right LE fasciotomy wound vacs to suction with good seal.  Output 450 cc first shift, 225 second shift. Doppler signal AT right  And peroneal signal on the left. Bilateral feet warm to touch r > L, no sign of ischemic changes. Right groin incision healing well without hematoma    Assessment/Planning: POD # 1 Procedure Performed: 1.  Minx device closure left common femoral artery 2.  Harvest right greater saphenous vein 3.  Right common femoral endarterectomy with vein patch angioplasty 4.  Right ower extremity thromboembolectomy  5.  Right lower extremity 4 compartment fasciotomies   Plan for wound vac to stay in place until Monday.  Vascular will change vac at bedside. Patient is on Neo 40 mcg, no anticoagulation currently.  OK to start if neccessary per Neuro.   Mosetta Pigeonmma Maureen Collins 10/30/2017 9:08 AM --  Laboratory Lab Results: Recent Labs    11/05/2017 2034 10/30/17 0620  WBC 9.0 8.7  HGB 8.2* 7.8*  HCT 24.4* 23.7*  PLT 185 185   BMET Recent Labs    10/28/17 2315 11/07/2017 0807 10/30/17 0620  NA 137 138 142  K 3.5 4.2 3.4*  CL 105  --  112*  CO2 23  --  23  GLUCOSE 106*  --  102*  BUN 17  --  10  CREATININE 1.25*  --  1.22  CALCIUM 8.7*  --  7.8*    COAG Lab Results  Component Value Date   INR 2.29 10/30/2017   INR 2.61 10/28/2017   INR 1.9 02/01/2012   No results found for: PTT    I have interviewed and examined patient with PA and agree with assessment and plan above.  He has only peroneal signal on the left but foot appears well-perfused.  Right side is very strong dorsalis pedis foot is warm and very well perfused.  Okay for  anticoagulation from vascular standpoint.  We will plan to change wound VAC on Monday if it can hold suction over the weekend.  Jaymi Tinner C. Randie Heinzain, MD [facility] [place] Office: [phone] Pager: [phone]

## 2017-10-30 NOTE — Progress Notes (Addendum)
PULMONARY / CRITICAL CARE MEDICINE   Name: Hunter Adkins MRN: [id number] DOB: [birth year]    ADMISSION DATE:  October 31, 2017 CONSULTATION DATE:  2017-10-31  REFERRING MD:  Wilford Corner  CHIEF COMPLAINT:  AMS  HISTORY OF PRESENT ILLNESS:  Pt is encephelopathic; therefore, this HPI is obtained from chart review. Hunter Adkins is a 77 y.o. male former smoker quit 2013 with PMH as outlined below including but not limited to AF on coumadin, CHF, COPD, DVT, EtOH abuse.  He was in his usual state of health evening of 7/31 and was at a bar at a pool tournament when he had sudden onset of aphasia and right sided weakness around 2226.  He was taken to [facility] ED where he was found to have mutism, left gaze preference, right hemianopsia, right hemiplegia.  CT of head showed evolving left MCA infarct and CTA showed left ICA occlusion and left M1 and A1 occlusion.  He was deemed to not be a candidate for tPA due to being therapeutic on coumadin.  He was subsequently transferred to [facility] where he was taken to IR and had left common carotid arteriogram followed by complete revascularization of occluded left ICA, left MCA, left ACA.  Post procedure, he returned to the ICU on vent and PCCM was asked to assist with vent management.  Of note, he was also found to have cold RLE prior to IR procedure with no palpable pulses.  Vascular has been consulted saw patient October 31, 2017 am .    SUBJECTIVE:  On vent, sedated with propofol, on Neo at 40 mcg.Weaning on 12/5. Low grad fever 100.4 last 24 hours, WBC WNL, wound vac output total 675  VITAL SIGNS: BP 132/64   Pulse 61   Temp 97.9 F (36.6 C)   Resp 15   Ht 5\' 6"  (1.676 m)   Wt 154 lb 5.2 oz (70 kg)   SpO2 100%   BMI 24.91 kg/m   HEMODYNAMICS:    VENTILATOR SETTINGS: Vent Mode: PSV;CPAP FiO2 (%):  [30 %-100 %] 30 % Set Rate:  [16 bmp] 16 bmp Vt Set:  [510 mL] 510 mL PEEP:  [5 cmH20] 5 cmH20 Pressure Support:  [12 cmH20] 12 cmH20 Plateau Pressure:  [14 cmH20-21 cmH20]  14 cmH20  INTAKE / OUTPUT: I/O last 3 completed shifts: In: 6030.1 [I.V.:4742.1; Blood:938; IV Piggyback:350] Out: 3600 [Urine:2225; Drains:675; Blood:700]   PHYSICAL EXAMINATION: General: Adult male, supine in bed, weaning, facial grimacing noted Neuro: Sedated, does not follow commands,  but does move  LLE and LUE spontaneously, right hemiplegia HEENT: [place]/AT. Sclerae anicteric.  ETT secure and  in place. Cardiovascular:S1, S2,  RRR, no M/R/G.  Lungs: Bilateral Chest excursion,Respirations even and unlabored.Some wheezing noted bilaterally, some rhonchi bilaterally Abdomen: BS x 4, soft, NT/ND.  Musculoskeletal: No gross deformities, no edema. RLE warm  to touch, + DP and  PT pulses noted. Skin: Intact, warm, no rashes.Wound vac to RLE    LABS:  BMET Recent Labs  Lab 10/28/17 2315 Oct 31, 2017 0807 10/30/17 0620  NA 137 138 142  K 3.5 4.2 3.4*  CL 105  --  112*  CO2 23  --  23  BUN 17  --  10  CREATININE 1.25*  --  1.22  GLUCOSE 106*  --  102*    Electrolytes Recent Labs  Lab 10/28/17 2315 10/30/17 0620  CALCIUM 8.7* 7.8*  MG  --  1.8  PHOS  --  2.5    CBC Recent Labs  Lab 10/28/17 2315  2017/11/07 1305 11-07-17 2034 10/30/17 0620  WBC 6.3  --   --  9.0 8.7  HGB 13.5   < > 8.9* 8.2* 7.8*  HCT 39.1*   < > 26.5* 24.4* 23.7*  PLT 232  --   --  185 185   < > = values in this interval not displayed.    Coag's Recent Labs  Lab 10/28/17 2315 10/30/17 0620  APTT 42*  --   INR 2.61 2.29    Sepsis Markers Recent Labs  Lab 10/30/17 0620  LATICACIDVEN 1.0    ABG Recent Labs  Lab November 07, 2017 0807 07-Nov-2017 1114 10/30/17 0422  PHART 7.360 7.383 7.417  PCO2ART 39.5 39.2 36.4  PO2ART 406.0* 425.0* 103    Liver Enzymes Recent Labs  Lab 10/28/17 2315 10/30/17 0620  AST 21 19  ALT 13 11  ALKPHOS 58 42  BILITOT 0.8 0.8  ALBUMIN 4.0 2.8*    Cardiac Enzymes Recent Labs  Lab 10/28/17 2315  TROPONINI <0.03    Glucose Recent Labs  Lab  10/28/17 2313  GLUCAP 102*    Imaging Dg Chest Port 1 View  Result Date: 10/30/2017 CLINICAL DATA:  77 year old male with emergent large vessel occlusion of the left ICA status post endovascular revascularization. Intubated. EXAM: PORTABLE CHEST 1 VIEW COMPARISON:  2017-11-07 and earlier. FINDINGS: Portable AP semi upright view at 0618 hours. Endotracheal tube tip is in good position between the level the clavicles and carina. Stable cardiomegaly and mediastinal contours. Mildly regressed widespread increased interstitial opacity. Continued hypo ventilation at the left lung base. No pneumothorax. No large pleural effusion. Calcified aortic atherosclerosis. IMPRESSION: 1. Endotracheal tube in good position. 2. Regressed pulmonary interstitial opacity since yesterday compatible with regressed edema. 3. Cardiomegaly with left lung base hypo ventilation. Electronically Signed   By: Odessa Fleming M.D.   On: 10/30/2017 08:28     STUDIES:  CT head / cerebral perfusion 7/31 > acute EVLO with occlusion of left ICA, absent flow in left MCA, core infarct in left cerebral hemisphere. Echo 8/1 > EF 40-45%, Aortic valve: nodular calcification of leaflet tips. There was mild regurgitation,Mitral valve: Nodular calcification of leaflet tips with diastolic bowing moderate appearing MS. Severely calcified annulus. Moderately thickened, moderately calcified leaflets .There was mild regurgitation.  Left atrium: The atrium was severely dilated. - Right atrium: The atrium was severely dilated. - Atrial septum: No defect or patent foramen ovale was identified. - Tricuspid valve: There was moderate regurgitation. - Pulmonary arteries: PA peak pressure: 41 mm Hg (S). - Pericardium, extracardiac: A trivial pericardial effusion was   identified posterior to the heart.  MRI brain 8/2 >   CULTURES: None.  ANTIBIOTICS: None.>> Monitor off abx for now  SIGNIFICANT EVENTS: 7/31 > admit. 8/1>> IR : left common carotid  arteriogram followed by complete revascularization of occluded left ICA, left MCA, left ACA.   LINES/TUBES: ETT 7/31 >  Arterial line 7/31 >   DISCUSSION: 77 y.o. male with hx AF on coumadin, presented to [facility] with stroke like symptoms.  Found to have evolving left MCA infarct.  Transferred to [facility] and taken to IR where he had complete revascularization of occluded left ICA, MCA, ACA.  ASSESSMENT / PLAN:  PULMONARY A: Respiratory insufficiency - s/p intubation for IR procedure. Former smoker Acute pulmonary edema 8/1. Pulmonary HTN per echo CXR shows L lung base hypoventilation P:   Full vent support. Titrate oxygen to maintain saturations > 92% SBT as able VAP prevention measures. CXR 8/3. Consider  Lasix 20 mg Minimize sedation Will add prn albuterol nebs  OOB as able Pulmonary follow up to assess for OSA and PAH if recovery from stroke allows  CARDIOVASCULAR A:  Cold RLE without palpable pulses. Hx AF on coumadin (INR 2.61 this admit), DVT, CHF, CM. Pressor dependent BP goal is 120-180 per neuro P:  Vascular surgery consulted by primary team, seen 8/1 am  femoral embolectomy performed 8/1. SBP goal 120-180  per neurology - continue cleviprex PRN. Echo as above>> consider cards follow up Pending MRI 8/2 to rule out hemorrhagic conversion before coagulation initiated Anticoagulation per neurology,  Continue preadmission simvastatin. Hold preadmission carvedilol, furosemide, lisinopril, sildenafil while on pressors  RENAL A:   AKI. Hypo mag Hypokalemia P:   NS @ 50. Trend BMP and UO Replete electrolytes as needed  Avoid nephrotoxic medications Maintain renal prefusion .  GASTROINTESTINAL A:   GI prophylaxis. Nutrition. P:   SUP: Famotidine. Place Cortrak TF per dietitian  HEMATOLOGIC A:   On chronic anticoagulation for Afib. No anticoagulation at present Hx anemia. VTE Prophylaxis. Hemoglobin 13.5-> 8.9->8.2->7.8 Likely related to current  procedure and surgeries For MRI 8/2 to rule out hemorrhagic conversion before anticoagulation resumed INR 2.61  P:  Anticoagulation per neurology, >> for MRI 8/2  Close CBC trending Monitor for obvious bleeding PRBC transfusion if less than 7.0 SCD's.   INFECTIOUS A:   No indication of infection. P:   Trend fever and WBC CXR prn. Culture as is clinically indicated  ENDOCRINE A:   No acute issues.   P:   CBG Q 4 SSI to keep glucose < 180.  NEUROLOGIC A:   Acute left MCA infarct with occlusion of left ACA and ICA - s/p IR revascularization 8/1. Sedation needs due to mechanical ventilation. Hx EtOH abuse. P:   Neurology following / managing. F/u on brain MRI. Sedation:  Propofol gtt / Fentanyl PRN. RASS goal: 0 to -1. Daily WUA. Thiamine / Folate. Assess UDS.  Acute RLE ischemia Vascular Surgery intervention 8/1 Minx device closure left common femoral artery Harvest right greater saphenous vein Right common femoral endarterectomy with vein patch angioplasty Right lower extremity thromboembolectomy  Right lower extremity 4 compartment fasciotomies Right Groin incision without hematoma, healing Plan: Frequent doppler assessment bilateral LE pulses Management and care per Vascular surgery>> appreciate assistance Wound vac per vascular surgery    Family updated: None available.  Interdisciplinary Family Meeting v Palliative Care Meeting:  Due by: 11/04/17.  APP CC time: 35 min.   Bevelyn NgoSarah F. Deloros Beretta, AGACNP-BC [facility] Pager:  [phone] 10/30/2017, 9:24 AM

## 2017-10-30 NOTE — Progress Notes (Addendum)
OT Cancellation Note  Patient Details Name: Geri SeminoleJerry D Shain MRN: [id number] DOB: 1940/12/15   Cancelled Treatment:    Reason Eval/Treat Not Completed: Medical issues which prohibited therapy (intubated, sedated); will follow up as schedule permits and as pt is medically ready.   Marcy SirenBreanna Jaysha Lasure, OT Pager [phone] 10/30/2017   Orlando PennerBreanna L Kinsley Nicklaus 10/30/2017, 1:02 PM

## 2017-10-30 NOTE — Progress Notes (Signed)
Nutrition Follow-up  DOCUMENTATION CODES:   Not applicable  INTERVENTION:   -Once cortrak tube is placed and placement is confirmed:  Initiate TF with Vital AF 1.2 at goal rate of 50 ml/h (1200 ml per day) and Prostat 30 ml daily to provide 1540 kcals, 105 gm protein, 973 ml free water daily.  TF regimen with current propofol rate will provide 1980 kcals.   NUTRITION DIAGNOSIS:   Inadequate oral intake related to inability to eat as evidenced by NPO status.  Ongoing  GOAL:   Patient will meet greater than or equal to 90% of their needs  Progressing  MONITOR:   Vent status, Labs, Weight trends, Skin, I & O's  REASON FOR ASSESSMENT:   Consult Enteral/tube feeding initiation and management  ASSESSMENT:   77 y.o. male with hx AF on coumadin, presented to [facility] with stroke like symptoms.  Found to have evolving left MCA infarct.  [facility] and taken to IR where he had complete revascularization of occluded left ICA, MCA, ACA.  8/1- s/p right femoral embolectomy and fasciotomy  Patient is currently intubated on ventilator support MV: 8.5 L/min Temp (24hrs), Avg:98.2 F (36.8 C), Min:94.8 F (34.9 C), Max:100.4 F (38 C)  Propofol: 16.7 ml/hr (441 kcals)  Pt more calm at time of visit. Case discussed with RN, who confirms plans to start TF today. Pt awaiting cortrak tube placement to start TF.   Per vascular notes, plan to continue with wound vac until 11/08/2017.  Labs reviewed: K: 3.4 (on IV supplementation), Mg and Phos WDL.    NUTRITION - FOCUSED PHYSICAL EXAM:    Most Recent Value  Orbital Region  No depletion  Upper Arm Region  No depletion  Thoracic and Lumbar Region  No depletion  Buccal Region  No depletion  Temple Region  No depletion  Clavicle Bone Region  No depletion  Clavicle and Acromion Bone Region  No depletion  Scapular Bone Region  No depletion  Dorsal Hand  No depletion  Patellar Region  No depletion  Anterior Thigh Region  No  depletion  Posterior Calf Region  Mild depletion  Edema (RD Assessment)  None  Hair  Reviewed  Eyes  Reviewed  Mouth  Reviewed  Skin  Reviewed  Nails  Reviewed       Diet Order:   Diet Order           Diet NPO time specified  Diet effective now          EDUCATION NEEDS:   Not appropriate for education at this time  Skin:  Skin Assessment: Skin Integrity Issues: Skin Integrity Issues:: Incisions, Wound VAC Wound Vac: rt groin Incisions: closed lt anterior groin, lt groin, rt groin Other: n/a  Last BM:  PTA  Height:   Ht Readings from Last 1 Encounters:  05/31/17 5\' 6"  (1.676 m)    Weight:   Wt Readings from Last 1 Encounters:  10/30/17 162 lb 11.2 oz (73.8 kg)    Ideal Body Weight:  64.5 kg  BMI:  Body mass index is 26.26 kg/m.  Estimated Nutritional Needs:   Kcal:  1761  Protein:  100-115 grams  Fluid:  > 1.8 L    Sunita Demond A. Mayford KnifeWilliams, RD, LDN, CDE Pager: [phone] After hours Pager: [phone]

## 2017-10-30 NOTE — Plan of Care (Signed)
Patient's family is in hospital and supportive.

## 2017-10-31 ENCOUNTER — Inpatient Hospital Stay (HOSPITAL_COMMUNITY): Payer: PPO

## 2017-10-31 LAB — COMPREHENSIVE METABOLIC PANEL
ALT: 9 U/L (ref 0–44)
AST: 17 U/L (ref 15–41)
Albumin: 2.6 g/dL — ABNORMAL LOW (ref 3.5–5.0)
Alkaline Phosphatase: 44 U/L (ref 38–126)
Anion gap: 6 (ref 5–15)
BUN: 11 mg/dL (ref 8–23)
CHLORIDE: 114 mmol/L — AB (ref 98–111)
CO2: 23 mmol/L (ref 22–32)
Calcium: 7.8 mg/dL — ABNORMAL LOW (ref 8.9–10.3)
Creatinine, Ser: 1.04 mg/dL (ref 0.61–1.24)
GFR calc Af Amer: >60 mL/min (ref >60)
Glucose, Bld: 139 mg/dL — ABNORMAL HIGH (ref 70–99)
POTASSIUM: 3.5 mmol/L (ref 3.5–5.1)
SODIUM: 143 mmol/L (ref 135–145)
Total Bilirubin: 0.7 mg/dL (ref 0.3–1.2)
Total Protein: 5 g/dL — ABNORMAL LOW (ref 6.5–8.1)

## 2017-10-31 LAB — SODIUM
SODIUM: 144 mmol/L (ref 135–145)
Sodium: 147 mmol/L — ABNORMAL HIGH (ref 135–145)

## 2017-10-31 LAB — CBC
HCT: 22.3 % — ABNORMAL LOW (ref 39.0–52.0)
Hemoglobin: 7.5 g/dL — ABNORMAL LOW (ref 13.0–17.0)
MCH: 31.4 pg (ref 26.0–34.0)
MCHC: 33.6 g/dL (ref 30.0–36.0)
MCV: 93.3 fL (ref 78.0–100.0)
PLATELETS: 165 10*3/uL (ref 150–400)
RBC: 2.39 MIL/uL — ABNORMAL LOW (ref 4.22–5.81)
RDW: 13.4 % (ref 11.5–15.5)
WBC: 8.6 10*3/uL (ref 4.0–10.5)

## 2017-10-31 LAB — PROTIME-INR
INR: 2.07
PROTHROMBIN TIME: 23.1 s — AB (ref 11.4–15.2)

## 2017-10-31 LAB — GLUCOSE, CAPILLARY
Glucose-Capillary: 140 mg/dL — ABNORMAL HIGH (ref 70–99)
Glucose-Capillary: 109 mg/dL — ABNORMAL HIGH (ref 70–99)
Glucose-Capillary: 112 mg/dL — ABNORMAL HIGH (ref 70–99)
Glucose-Capillary: 98 mg/dL (ref 70–99)

## 2017-10-31 LAB — MAGNESIUM: MAGNESIUM: 2.2 mg/dL (ref 1.7–2.4)

## 2017-10-31 MED ORDER — SODIUM CHLORIDE 3 % IV SOLN
INTRAVENOUS | Status: DC
  Administered 2017-10-31 – 2017-11-01 (×4): 75 mL/h via INTRAVENOUS
  Filled 2017-10-31 (×5): qty 500

## 2017-10-31 NOTE — Progress Notes (Signed)
Spoke with Radiologist, Dr Mosetta PuttGrady, regarding MRI/MRA results:  1. Large acute left MCA infarct with extensive edema, 5 mm of rightward midline shift, and left uncal herniation. Associated petechial hemorrhage without malignant hemorrhagic transformation. 2. Minimal intraventricular hemorrhage. 3. Additional small acute infarcts scattered throughout both cerebral hemispheres. 4. Chronic left basal ganglia and left thalamic lacunar infarcts. 5. Persistent patency of the intracranial left ICA and left MCA following recent revascularization. 6. Mild-to-moderate bilateral ICA stenoses.  I called Dr Lucia GaskinsAhern immediately and decision was made to start hypertonic saline for edema.   I spoke with the patient's nurse who reported no significant change in patient's exam; however, he has been intubated, plegic on the right, and unable to follow any commands. Pupils pinpoint and sluggish. Discussed plan to start 3% saline.  Spoke several times to Dr Roselyn Beringrim, who is following the patient for critical care. Requested a central line for hypertonic saline. Unfortunately the patient was on warfarin PTA and INR yesterday 2.29. Stat repeat pending. Dr Roselyn Beringrim aware and will work with situation.   Spoke with the family about the new findings on MRI. They were quite upset as would be expected. Explained the plan to treat with hypertonic saline but also told them prognosis was grim. Briefly touched on code status - they want everything done at this time.  EF 40 to 45% by recent echo. Will need to monitor for S/S of CHF with 3%.  CXR this AM showed no acute abnormality.  Delton Seeavid Zanetta Dehaan PA-C [facility] Pager [phone] 10/31/2017, 3:52 PM

## 2017-10-31 NOTE — Progress Notes (Signed)
STROKE TEAM PROGRESS NOTE   SUBJECTIVE (INTERVAL HISTORY) His RN is at the bedside.  Patient is still intubated and sedated. No events overnight, stable. Tried to go to MRI but HRs 30s and was brought back to ICU will try again today.  OBJECTIVE Temp:  [97.4 F (36.3 C)-100.2 F (37.9 C)] 97.4 F (36.3 C) (08/03 0400) Pulse Rate:  [31-128] 46 (08/03 0700) Cardiac Rhythm: Atrial fibrillation (08/02 2000) Resp:  [12-28] 17 (08/03 0700) BP: (100-149)/(54-84) 114/62 (08/03 0700) SpO2:  [89 %-100 %] 100 % (08/03 0700) Arterial Line BP: (110-161)/(49-83) 121/57 (08/03 0700) FiO2 (%):  [30 %] 30 % (08/03 0323) Weight:  [160 lb 0.9 oz (72.6 kg)-163 lb 5.8 oz (74.1 kg)] 163 lb 5.8 oz (74.1 kg) (08/03 0500)  Recent Labs  Lab 10/28/17 2313 10/31/17 0400  GLUCAP 102* 140*   Recent Labs  Lab 10/28/17 2315 11/20/2017 0807 10/30/17 0620 10/31/17 0045  NA 137 138 142 143  K 3.5 4.2 3.4* 3.5  CL 105  --  112* 114*  CO2 23  --  23 23  GLUCOSE 106*  --  102* 139*  BUN 17  --  10 11  CREATININE 1.25*  --  1.22 1.04  CALCIUM 8.7*  --  7.8* 7.8*  MG  --   --  1.8 2.2  PHOS  --   --  2.5  --    Recent Labs  Lab 10/28/17 2315 10/30/17 0620 10/31/17 0045  AST 21 19 17   ALT 13 11 9   ALKPHOS 58 42 44  BILITOT 0.8 0.8 0.7  PROT 7.5 5.2* 5.0*  ALBUMIN 4.0 2.8* 2.6*   Recent Labs  Lab 10/28/17 2315 10/31/2017 0807 11/13/2017 1305 11/25/2017 2034 10/30/17 0620 10/31/17 0045  WBC 6.3  --   --  9.0 8.7 8.6  NEUTROABS 3.6  --   --   --  6.1  --   HGB 13.5 10.5* 8.9* 8.2* 7.8* 7.5*  HCT 39.1* 31.0* 26.5* 24.4* 23.7* 22.3*  MCV 88.6  --   --  92.1 92.6 93.3  PLT 232  --   --  185 185 165   Recent Labs  Lab 10/28/17 2315 10/30/17 0620  CKTOTAL  --  55  TROPONINI <0.03  --    Recent Labs    10/28/17 2315 10/30/17 0620  LABPROT 27.7* 25.0*  INR 2.61 2.29   No results for input(s): COLORURINE, LABSPEC, PHURINE, GLUCOSEU, HGBUR, BILIRUBINUR, KETONESUR, PROTEINUR, UROBILINOGEN,  NITRITE, LEUKOCYTESUR in the last 72 hours.  Invalid input(s): APPERANCEUR     Component Value Date/Time   CHOL 117 11/21/2017 0500   CHOL 126 01/27/2012 0329   TRIG 70 11/11/2017 0537   TRIG 64 01/27/2012 0329   HDL 37 (L) 10/31/2017 0500   HDL 43 01/27/2012 0329   CHOLHDL 3.2 11/15/2017 0500   VLDL 13 11/13/2017 0500   VLDL 13 01/27/2012 0329   LDLCALC 67 11/27/2017 0500   LDLCALC 70 01/27/2012 0329   Lab Results  Component Value Date   HGBA1C 5.8 (H) 11/06/2017      Component Value Date/Time   LABOPIA NONE DETECTED 11/09/2017 0730   COCAINSCRNUR NONE DETECTED 11/06/2017 0730   LABBENZ NONE DETECTED 11/25/2017 0730   AMPHETMU NONE DETECTED 11/12/2017 0730   THCU NONE DETECTED 11/08/2017 0730   LABBARB NONE DETECTED 11/26/2017 0730    Recent Labs  Lab 10/28/17 2315  ETH <10    I have personally reviewed the radiological images below and agree with  the radiology interpretations.  Ct Angio Head W Or Wo Contrast  Result Date: 10/30/2017 CLINICAL DATA:  Initial evaluation for EXAM: CT ANGIOGRAPHY HEAD AND NECK CT PERFUSION BRAIN TECHNIQUE: Multidetector CT imaging of the head and neck was performed using the standard protocol during bolus administration of intravenous contrast. Multiplanar CT image reconstructions and MIPs were obtained to evaluate the vascular anatomy. Carotid stenosis measurements (when applicable) are obtained utilizing NASCET criteria, using the distal internal carotid diameter as the denominator. Multiphase CT imaging of the brain was performed following IV bolus contrast injection. Subsequent parametric perfusion maps were calculated using RAPID software. CONTRAST:  OMNIPAQUE IOHEXOL 350 MG/ML SOLN COMPARISON:  Prior head CT from earlier the same day. FINDINGS: CTA NECK FINDINGS Aortic arch: Visualized aortic arch of normal caliber with normal 3 vessel morphology. Atheromatous plaque about the use arch and origin of the great vessels without  hemodynamically significant stenosis. Visualized subclavian arteries widely patent Right carotid system: Scattered atheromatous plaque within the right common carotid artery without stenosis. Atherosclerotic change about the right bifurcation/proximal right ICA without significant stenosis. Right ICA widely patent distally to the skull base without stenosis, dissection, or occlusion. Left carotid system: Left common carotid artery patent from its origin to the bifurcation without significant stenosis. Abrupt occlusion of the left ICA just distal to the bifurcation. Left ICA remains occluded within the neck. Vertebral arteries: Both of the vertebral arteries arise from the subclavian arteries. Left vertebral artery dominant. Vertebral arteries patent within the neck without stenosis, dissection, or occlusion. Skeleton: No acute osseus abnormality. No discrete lytic or blastic osseous lesions. Moderate cervical spondylolysis at C4-5 through C6-7. Other neck: No other acute abnormality within the neck. Upper chest: Visualized upper chest demonstrates no acute finding. Severe emphysema. Review of the MIP images confirms the above findings CTA HEAD FINDINGS Anterior circulation: Left ICA remains occluded to the terminus. Left M1 segment and proximal left MCA branches occluded. Scant collateral flow seen distally. Multifocal atheromatous plaque throughout the cavernous/supraclinoid right ICA with moderate multifocal stenosis right M1 widely patent. No proximal right M2 occlusion distal right MCA branches demonstrate atheromatous irregularity but are perfused to their distal aspects. Right A1 patent. Retrograde opacification of the left A1 which appears slightly hypoplastic. Azygos ACA widely patent to its distal aspects. Posterior circulation: Vertebral arteries patent to the vertebrobasilar junction without stenosis. Left vertebral artery dominant posterior inferior cerebral arteries patent bilaterally. Basilar patent to  its distal aspect without stenosis. Superior cerebral arteries patent bilaterally. Both of the PCAs patent to their distal aspects. Prominent posterior communicating arteries noted bilaterally. Venous sinuses: Patent. Anatomic variants: None significant. Delayed phase: 5 mm focus of parenchymal enhancement along the gray-white matter differentiation of the anterior right frontal lobe noted (series 13, image 20), indeterminate, but favored to be vascular in nature. No surrounding edema or other abnormality. No other abnormal enhancement. Review of the MIP images confirms the above findings CT Brain Perfusion Findings: CBF (<30%) Volume: 40mL Perfusion (Tmax>6.0s) volume: Mismatch Volume: Infarction Location:Evidence for acute ischemic infarct involving the left MCA distribution, with involvement of the left insular region and subcortical and deep white matter of the left cerebral hemisphere. Surrounding ischemic penumbra involves much of the left cerebral hemisphere, with additional scattered perfusion abnormality within the right cerebral hemisphere. Findings are felt to be at least partially artifactual in nature. IMPRESSION: 1. Acute EVLO with occlusion of the left ICA just distal to the bifurcation. Left ICA remains occluded to the terminus with absent  flow within the left MCA artery. Fairly mild collateralization seen distally within the left MCA distribution. 2. Evidence for core infarct involving the left cerebral hemisphere as above. Surrounding ischemic penumbra with perfusion mismatch volume of 483 mL, suspected to at least in part be artifactual in nature on this examination. However, a fairly substantial surrounding penumbra is still somewhat suspected. 3. Patent azygos ACA. 4. Moderate atheromatous plaque throughout the right carotid siphon with moderate multifocal stenosis. No other high-grade or hemodynamically significant stenosis identified. 5. Emphysema. Critical Value/emergent results  were called by telephone at the time of interpretation on 30-Oct-2017 at 12:35 am to Dr. Reesa Chew , who verbally acknowledged these results. Electronically Signed   By: Rise Mu M.D.   On: 10/30/2017 00:56   Ct Angio Neck W Or Wo Contrast  Result Date: October 30, 2017 CLINICAL DATA:  Initial evaluation for EXAM: CT ANGIOGRAPHY HEAD AND NECK CT PERFUSION BRAIN TECHNIQUE: Multidetector CT imaging of the head and neck was performed using the standard protocol during bolus administration of intravenous contrast. Multiplanar CT image reconstructions and MIPs were obtained to evaluate the vascular anatomy. Carotid stenosis measurements (when applicable) are obtained utilizing NASCET criteria, using the distal internal carotid diameter as the denominator. Multiphase CT imaging of the brain was performed following IV bolus contrast injection. Subsequent parametric perfusion maps were calculated using RAPID software. CONTRAST:  OMNIPAQUE IOHEXOL 350 MG/ML SOLN COMPARISON:  Prior head CT from earlier the same day. FINDINGS: CTA NECK FINDINGS Aortic arch: Visualized aortic arch of normal caliber with normal 3 vessel morphology. Atheromatous plaque about the use arch and origin of the great vessels without hemodynamically significant stenosis. Visualized subclavian arteries widely patent Right carotid system: Scattered atheromatous plaque within the right common carotid artery without stenosis. Atherosclerotic change about the right bifurcation/proximal right ICA without significant stenosis. Right ICA widely patent distally to the skull base without stenosis, dissection, or occlusion. Left carotid system: Left common carotid artery patent from its origin to the bifurcation without significant stenosis. Abrupt occlusion of the left ICA just distal to the bifurcation. Left ICA remains occluded within the neck. Vertebral arteries: Both of the vertebral arteries arise from the subclavian arteries. Left vertebral  artery dominant. Vertebral arteries patent within the neck without stenosis, dissection, or occlusion. Skeleton: No acute osseus abnormality. No discrete lytic or blastic osseous lesions. Moderate cervical spondylolysis at C4-5 through C6-7. Other neck: No other acute abnormality within the neck. Upper chest: Visualized upper chest demonstrates no acute finding. Severe emphysema. Review of the MIP images confirms the above findings CTA HEAD FINDINGS Anterior circulation: Left ICA remains occluded to the terminus. Left M1 segment and proximal left MCA branches occluded. Scant collateral flow seen distally. Multifocal atheromatous plaque throughout the cavernous/supraclinoid right ICA with moderate multifocal stenosis right M1 widely patent. No proximal right M2 occlusion distal right MCA branches demonstrate atheromatous irregularity but are perfused to their distal aspects. Right A1 patent. Retrograde opacification of the left A1 which appears slightly hypoplastic. Azygos ACA widely patent to its distal aspects. Posterior circulation: Vertebral arteries patent to the vertebrobasilar junction without stenosis. Left vertebral artery dominant posterior inferior cerebral arteries patent bilaterally. Basilar patent to its distal aspect without stenosis. Superior cerebral arteries patent bilaterally. Both of the PCAs patent to their distal aspects. Prominent posterior communicating arteries noted bilaterally. Venous sinuses: Patent. Anatomic variants: None significant. Delayed phase: 5 mm focus of parenchymal enhancement along the gray-white matter differentiation of the anterior right frontal lobe noted (series 13, image  20), indeterminate, but favored to be vascular in nature. No surrounding edema or other abnormality. No other abnormal enhancement. Review of the MIP images confirms the above findings CT Brain Perfusion Findings: CBF (<30%) Volume: 40mL Perfusion (Tmax>6.0s) volume: Mismatch Volume:  Infarction Location:Evidence for acute ischemic infarct involving the left MCA distribution, with involvement of the left insular region and subcortical and deep white matter of the left cerebral hemisphere. Surrounding ischemic penumbra involves much of the left cerebral hemisphere, with additional scattered perfusion abnormality within the right cerebral hemisphere. Findings are felt to be at least partially artifactual in nature. IMPRESSION: 1. Acute EVLO with occlusion of the left ICA just distal to the bifurcation. Left ICA remains occluded to the terminus with absent flow within the left MCA artery. Fairly mild collateralization seen distally within the left MCA distribution. 2. Evidence for core infarct involving the left cerebral hemisphere as above. Surrounding ischemic penumbra with perfusion mismatch volume of 483 mL, suspected to at least in part be artifactual in nature on this examination. However, a fairly substantial surrounding penumbra is still somewhat suspected. 3. Patent azygos ACA. 4. Moderate atheromatous plaque throughout the right carotid siphon with moderate multifocal stenosis. No other high-grade or hemodynamically significant stenosis identified. 5. Emphysema. Critical Value/emergent results were called by telephone at the time of interpretation on 11/15/2017 at 12:35 am to Dr. Reesa Chew , who verbally acknowledged these results. Electronically Signed   By: Rise Mu M.D.   On: 11/12/2017 00:56   Ct Cerebral Perfusion W Contrast  Result Date: 11/01/2017 CLINICAL DATA:  Initial evaluation for EXAM: CT ANGIOGRAPHY HEAD AND NECK CT PERFUSION BRAIN TECHNIQUE: Multidetector CT imaging of the head and neck was performed using the standard protocol during bolus administration of intravenous contrast. Multiplanar CT image reconstructions and MIPs were obtained to evaluate the vascular anatomy. Carotid stenosis measurements (when applicable) are obtained utilizing NASCET criteria,  using the distal internal carotid diameter as the denominator. Multiphase CT imaging of the brain was performed following IV bolus contrast injection. Subsequent parametric perfusion maps were calculated using RAPID software. CONTRAST:  OMNIPAQUE IOHEXOL 350 MG/ML SOLN COMPARISON:  Prior head CT from earlier the same day. FINDINGS: CTA NECK FINDINGS Aortic arch: Visualized aortic arch of normal caliber with normal 3 vessel morphology. Atheromatous plaque about the use arch and origin of the great vessels without hemodynamically significant stenosis. Visualized subclavian arteries widely patent Right carotid system: Scattered atheromatous plaque within the right common carotid artery without stenosis. Atherosclerotic change about the right bifurcation/proximal right ICA without significant stenosis. Right ICA widely patent distally to the skull base without stenosis, dissection, or occlusion. Left carotid system: Left common carotid artery patent from its origin to the bifurcation without significant stenosis. Abrupt occlusion of the left ICA just distal to the bifurcation. Left ICA remains occluded within the neck. Vertebral arteries: Both of the vertebral arteries arise from the subclavian arteries. Left vertebral artery dominant. Vertebral arteries patent within the neck without stenosis, dissection, or occlusion. Skeleton: No acute osseus abnormality. No discrete lytic or blastic osseous lesions. Moderate cervical spondylolysis at C4-5 through C6-7. Other neck: No other acute abnormality within the neck. Upper chest: Visualized upper chest demonstrates no acute finding. Severe emphysema. Review of the MIP images confirms the above findings CTA HEAD FINDINGS Anterior circulation: Left ICA remains occluded to the terminus. Left M1 segment and proximal left MCA branches occluded. Scant collateral flow seen distally. Multifocal atheromatous plaque throughout the cavernous/supraclinoid right ICA with moderate  multifocal stenosis right M1 widely patent. No proximal right M2 occlusion distal right MCA branches demonstrate atheromatous irregularity but are perfused to their distal aspects. Right A1 patent. Retrograde opacification of the left A1 which appears slightly hypoplastic. Azygos ACA widely patent to its distal aspects. Posterior circulation: Vertebral arteries patent to the vertebrobasilar junction without stenosis. Left vertebral artery dominant posterior inferior cerebral arteries patent bilaterally. Basilar patent to its distal aspect without stenosis. Superior cerebral arteries patent bilaterally. Both of the PCAs patent to their distal aspects. Prominent posterior communicating arteries noted bilaterally. Venous sinuses: Patent. Anatomic variants: None significant. Delayed phase: 5 mm focus of parenchymal enhancement along the gray-white matter differentiation of the anterior right frontal lobe noted (series 13, image 20), indeterminate, but favored to be vascular in nature. No surrounding edema or other abnormality. No other abnormal enhancement. Review of the MIP images confirms the above findings CT Brain Perfusion Findings: CBF (<30%) Volume: 40mL Perfusion (Tmax>6.0s) volume: 523mL Mismatch Volume: 483mL Infarction Location:Evidence for acute ischemic infarct involving the left MCA distribution, with involvement of the left insular region and subcortical and deep white matter of the left cerebral hemisphere. Surrounding ischemic penumbra involves much of the left cerebral hemisphere, with additional scattered perfusion abnormality within the right cerebral hemisphere. Findings are felt to be at least partially artifactual in nature. IMPRESSION: 1. Acute EVLO with occlusion of the left ICA just distal to the bifurcation. Left ICA remains occluded to the terminus with absent flow within the left MCA artery. Fairly mild collateralization seen distally within the left MCA distribution. 2. Evidence for core  infarct involving the left cerebral hemisphere as above. Surrounding ischemic penumbra with perfusion mismatch volume of 483 mL, suspected to at least in part be artifactual in nature on this examination. However, a fairly substantial surrounding penumbra is still somewhat suspected. 3. Patent azygos ACA. 4. Moderate atheromatous plaque throughout the right carotid siphon with moderate multifocal stenosis. No other high-grade or hemodynamically significant stenosis identified. 5. Emphysema. Critical Value/emergent results were called by telephone at the time of interpretation on 11/17/2017 at 12:35 am to Dr. Reesa ChewJASON SEBESTO , who verbally acknowledged these results. Electronically Signed   By: Rise MuBenjamin  McClintock M.D.   On: 11/13/2017 00:56   Ct Head Code Stroke Wo Contrast  Result Date: 10/28/2017 CLINICAL DATA:  Code stroke. Initial evaluation for acute right-sided weakness. EXAM: CT HEAD WITHOUT CONTRAST TECHNIQUE: Contiguous axial images were obtained from the base of the skull through the vertex without intravenous contrast. COMPARISON:  None available. FINDINGS: Brain: Generalized age-related cerebral atrophy with chronic small vessel ischemic disease. Remote lacunar infarcts within the left basal ganglia and thalamus. There is subtle asymmetric hypodensity at the posterior left insula extending posteriorly into the left M6 territory, with additional possible hypodensity within the left M2 territory, concerning for evolving acute ischemic left MCA territory infarct. No acute intracranial hemorrhage. No mass lesion, midline shift or mass effect. No hydrocephalus. No extra-axial fluid collection. Vascular: Asymmetric hyperdensity involving the left M1 segment extending into proximal left M2 branches, concerning for low thrombus in large vessel occlusion. Scattered vascular calcifications noted within the carotid siphons. Skull: Scalp soft tissues and calvarium within normal limits. Sinuses/Orbits: Globes and  orbital soft tissues within normal limits. Paranasal sinuses and mastoids are clear. Other: None. ASPECTS [facility]([place] Stroke Program Early CT Score) - Ganglionic level infarction (caudate, lentiform nuclei, internal capsule, insula, M1-M3 cortex): 5 - Supraganglionic infarction (M4-M6 cortex): 2 Total score (0-10 with 10 being normal): 7 IMPRESSION: 1.  Asymmetric hyperdensity within the left M1 segment, concerning for large vessel occlusion. Subtle early hypodensity within the left cerebral hemisphere concerning for acute left MCA territory infarct. No acute intracranial hemorrhage. 2. ASPECTS is 7. Critical Value/emergent results were called by telephone at the time of interpretation on 10/28/2017 at 11:40 pm to Dr. Lesly Rubenstein SUNG , who verbally acknowledged these results. Electronically Signed   By: Rise Mu M.D.   On: 10/28/2017 23:42   DSA endovascular revascularization of occluded Lt ICA distally ,Lt MCA and LT ACA with x 3 passes with tembotrap x 33mm retriever device and  X 1 pass with the 3mm x 20 mm treprovue retriever device achiving a TICI 2 b revascularization.   TTE  - Left ventricle: The cavity size was normal. Wall thickness was   normal. Systolic function was mildly to moderately reduced. The   estimated ejection fraction was in the range of 40% to 45%. - Aortic valve: nodular calcification of leaflet tips. There was   mild regurgitation. Valve area (VTI): 1.77 cm^2. Valve area   (Vmax): 2.95 cm^2. Valve area (Vmean): 2.18 cm^2. - Mitral valve: Nodular calcification of leaflet tips with   diastolic bowing moderate appearing MS. Severely calcified   annulus. Moderately thickened, moderately calcified leaflets .   There was mild regurgitation. Valve area by pressure half-time:   2.04 cm^2. Valve area by continuity equation (using LVOT flow):   0.84 cm^2. - Left atrium: The atrium was severely dilated. - Right atrium: The atrium was severely dilated. - Atrial septum: No  defect or patent foramen ovale was identified. - Tricuspid valve: There was moderate regurgitation. - Pulmonary arteries: PA peak pressure: 41 mm Hg (S). - Pericardium, extracardiac: A trivial pericardial effusion was   identified posterior to the heart.  MRI and MRA pending   PHYSICAL EXAM  Temp:  [97.4 F (36.3 C)-100.2 F (37.9 C)] 97.4 F (36.3 C) (08/03 0400) Pulse Rate:  [31-128] 46 (08/03 0700) Resp:  [12-28] 17 (08/03 0700) BP: (100-149)/(54-84) 114/62 (08/03 0700) SpO2:  [89 %-100 %] 100 % (08/03 0700) Arterial Line BP: (110-161)/(49-83) 121/57 (08/03 0700) FiO2 (%):  [30 %] 30 % (08/03 0323) Weight:  [160 lb 0.9 oz (72.6 kg)-163 lb 5.8 oz (74.1 kg)] 163 lb 5.8 oz (74.1 kg) (08/03 0500)  General - Well nourished, well developed, intubated and sedated.  Ophthalmologic - fundi not visualized due to noncooperation.  Cardiovascular - irregularly irregular heart rate and rhythm.  Neuro - intubated on sedation, not following commands, not open eyes with voice or pain stimulation.  With eyelid force opening, eye left gaze preference, PERRL, sluggish doll's eyes, not blinking to visual threat bilaterally.  Absence of corneal reflexes, weak gag and cough.  Facial asymmetry not able to tell due to ET tube.  On pain stimulation, moving left upper and lower extremities against gravities, localizing to pain.  However, right upper extremity 0/5 and right lower extremity 1/5.  DTR 1+, Babinski positive on the right. Sensation, coordination and gait not tested.    ASSESSMENT/PLAN Hunter Adkins is a 77 y.o. male with history of CHF, cardiomyopathy, A. fib on Coumadin, history of DVT, alcohol use admitted for aphasia, right-sided weakness, left gaze and right knee leg. No tPA given due to therapeutic INR.    Stroke:  Left hemisphere infarct due to left ICA occlusion status post thrombectomy, embolic secondary to A. fib  Resultant intubated and sedated with right hemiplegia  CT  head left MCA hyperdense  sign, left MCA territory subtle infarct  CTA head and neck left ICA, MCA, and ACA occlusion, moderate right ICA bulb and siphon atherosclerosis  MRI and MRA pending  2D Echo EF 40 to 45%  LDL 67  HgbA1c 5.8  SCDs for VTE prophylaxis  warfarin daily prior to admission, now on No antithrombotic due to concerns of hemorrhagic conversion, recent surgery with bloody drainage and anemia. Pending MRI to decide.  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  Had emergent right femoral embolectomy  Had right common femoral artery enterectomy with vein patch angioplasty  Had right LE 4 compartment fasciotomies  Wound VAC at right LE  VVS on board  Afib on coumadin  INR 2.61, therapeutic  May consider switch to NOAC when appropriate for stroke prevention  Pending MRI to rule out hemorrhagic conversion before anticoagulation  Acute blood loss anemia  Hemoglobin 13.5-> 8.9->8.2->7.8  Likely related to current procedure and surgeries  Close CBC monitoring  PRBC transfusion if less than 7.0  BP management Stable on the low side On neo  BP goal 120-180 now 24 h post procedure  Hyperlipidemia  Home meds: Zocor  LDL 69, goal < 70  Now on Zocor 40  Continue statin at discharge  Dysphagia   Currently intubated   NPO  Will put cortrak and start TF  Other Stroke Risk Factors  Advanced age  Former cigarette smoker quit 6 years ago  ETOH use -on folic acid and B1  Cardiomyopathy - EF 40 to 45%  History of DVT  Other Active Problems  Elevated creatinine 1.25->1.22  Hospital day # 2  This patient is critically ill due to left hemispheric stroke, 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 fib, anemia and at significant risk of neurological worsening,  death form recurrent stroke, hemorrhagic conversion, right leg ischemia, heart failure, hypovolemic shock. This patient's care requires constant monitoring of vital signs, hemodynamics, respiratory and cardiac monitoring, review of multiple databases, neurological assessment, discussion with family, other specialists and medical decision making of high complexity. I spent 45 minutes of neurocritical care time in the care of this patient.   Personally examined patient and images, and have participated in and made any corrections needed to history, physical, neuro exam,assessment and plan as stated above.  I have personally obtained the history, evaluated lab date, reviewed imaging studies and agree with radiology interpretations.    Naomie Dean, MD Stroke Neurology      To contact Stroke Continuity provider, please refer to [email or web address]. After hours, contact General Neurology

## 2017-10-31 NOTE — Progress Notes (Signed)
[facility]  Subjective  - sedated does not follow commands   Objective 126/72 78 (!) 96.2 F (35.7 C) (Axillary) (!) 24 99%  Intake/Output Summary (Last 24 hours) at 10/31/2017 1325 Last data filed at 10/31/2017 1000 Gross per 24 hour  Intake 3427.04 ml  Output 1065 ml  Net 2362.04 ml   Doppler signals bilateral feet Left and right groin no hematoma Flaccid paralysis right side  Assessment/Planning: S/p right femoral endarterectomy viable extremities Significant left brain stroke neuro critical care following  Fabienne BrunsCharles Armarion Greek 10/31/2017 1:25 PM --  Laboratory Lab Results: Recent Labs    10/30/17 0620 10/31/17 0045  WBC 8.7 8.6  HGB 7.8* 7.5*  HCT 23.7* 22.3*  PLT 185 165   BMET Recent Labs    10/30/17 0620 10/31/17 0045  NA 142 143  K 3.4* 3.5  CL 112* 114*  CO2 23 23  GLUCOSE 102* 139*  BUN 10 11  CREATININE 1.22 1.04  CALCIUM 7.8* 7.8*    COAG Lab Results  Component Value Date   INR 2.29 10/30/2017   INR 2.61 10/28/2017   INR 1.9 02/01/2012   No results found for: PTT

## 2017-10-31 NOTE — Progress Notes (Addendum)
PULMONARY / CRITICAL CARE MEDICINE   Name: Hunter Adkins MRN: [id number] DOB: March 25, 1941    ADMISSION DATE:  11/05/2017 CONSULTATION DATE:  11/08/2017  REFERRING MD:  Wilford Corner  CHIEF COMPLAINT:  AMS  HISTORY OF PRESENT ILLNESS:   Hunter Adkins is a 77 y.o. male former smoker quit 2013 with PMH as outlined below including but not limited to AF on coumadin, CHF, COPD, DVT, EtOH abuse.  He was in his usual state of health evening of 7/31 and was at a bar at a pool tournament when he had sudden onset of aphasia and right sided weakness around 2226.  He was taken to [facility] ED where he was found to have mutism, left gaze preference, right hemianopsia, right hemiplegia.  CT of head showed evolving left MCA infarct and CTA showed left ICA occlusion and left M1 and A1 occlusion.  He was deemed to not be a candidate for tPA due to being therapeutic on coumadin.  He was subsequently transferred to [facility] where he was taken to IR and had left common carotid arteriogram followed by complete revascularization of occluded left ICA, left MCA, left ACA.  Post procedure, he returned to the ICU on vent and PCCM was asked to assist with vent management.  Of note, he was also found to have cold RLE prior to IR procedure with no palpable pulses.  Vascular had been consulted saw patient 10/31/2017 am .   PAST MEDICAL HISTORY :  He  has a past medical history of Anemia, Cardiomyopathy (HCC), CHF (congestive heart failure) (HCC), COPD (chronic obstructive pulmonary disease) (HCC), Dysrhythmia, ETOH abuse, GERD (gastroesophageal reflux disease), History of DVT of lower extremity, and History of Helicobacter pylori infection.  PAST SURGICAL HISTORY: He  has a past surgical history that includes Colonoscopy w/ polypectomy; Cardiac catheterization; Colonoscopy with propofol (N/A, 10/14/2017); Esophagogastroduodenoscopy (egd) with propofol (N/A, 10/14/2017); and Radiology with anesthesia (N/A, 11/14/2017).  No Known Allergies  No  current facility-administered medications on file prior to encounter.    Current Outpatient Medications on File Prior to Encounter  Medication Sig  . carvedilol (COREG) 6.25 MG tablet Take 6.25 mg by mouth 2 (two) times daily with a meal.  . folic acid (FOLVITE) 1 MG tablet Take 1 mg by mouth daily.  . furosemide (LASIX) 20 MG tablet Take 20 mg by mouth daily.  Marland Kitchen lisinopril (PRINIVIL,ZESTRIL) 2.5 MG tablet Take 2.5 mg by mouth daily.  Marland Kitchen omeprazole (PRILOSEC) 40 MG capsule Take 40 mg by mouth daily.  . sildenafil (REVATIO) 20 MG tablet Take 20-100 mg by mouth daily as needed.   . simvastatin (ZOCOR) 40 MG tablet Take 40 mg by mouth daily.  . vitamin B-12 (CYANOCOBALAMIN) 1000 MCG tablet Take 1,000 mcg by mouth daily.  Marland Kitchen warfarin (COUMADIN) 5 MG tablet Take 5 mg by mouth daily.    FAMILY HISTORY:  His family history is not on file.  SOCIAL HISTORY: He  reports that he quit smoking about 6 years ago. His smokeless tobacco use includes chew. He reports that he drinks about 1.2 oz of alcohol per week. He reports that he has current or past drug history.  SUBJECTIVE:  Propofol sedated  VITAL SIGNS: BP 126/72   Pulse 78   Temp (!) 96.2 F (35.7 C) (Axillary)   Resp (!) 24   Ht 5\' 6"  (1.676 m)   Wt 74.1 kg (163 lb 5.8 oz)   SpO2 99%   BMI 26.37 kg/m   HEMODYNAMICS:    VENTILATOR  SETTINGS: Vent Mode: PRVC FiO2 (%):  [30 %] 30 % Set Rate:  [16 bmp] 16 bmp Vt Set:  [500 mL-510 mL] 500 mL PEEP:  [5 cmH20] 5 cmH20 Pressure Support:  [10 cmH20-12 cmH20] 10 cmH20 Plateau Pressure:  [16 cmH20-18 cmH20] 16 cmH20  INTAKE / OUTPUT: I/O last 3 completed shifts: In: 5402 [I.V.:4394.4; NG/GT:807.5; IV Piggyback:200.1] Out: 2840 [Urine:2275; Drains:565]  PHYSICAL EXAMINATION: General:  WD/WN No obvious distress Neuro:  Sedated; right hemiplegia HEENT:  NA/AT PRRL; ETT in situ; FT in place Cardiovascular:  Occas extrasystoles. No M, R, G Lungs:  Clear Abdomen:  Soft, no guarding,  +BS Musculoskeletal:  RLE fasciotomy wound to suction Skin:  No edema  LABS:  BMET Recent Labs  Lab 10/28/17 2315 07/06/2017 0807 10/30/17 0620 10/31/17 0045  NA 137 138 142 143  K 3.5 4.2 3.4* 3.5  CL 105  --  112* 114*  CO2 23  --  23 23  BUN 17  --  10 11  CREATININE 1.25*  --  1.22 1.04  GLUCOSE 106*  --  102* 139*    Electrolytes Recent Labs  Lab 10/28/17 2315 10/30/17 0620 10/31/17 0045  CALCIUM 8.7* 7.8* 7.8*  MG  --  1.8 2.2  PHOS  --  2.5  --     CBC Recent Labs  Lab 07/06/2017 2034 10/30/17 0620 10/31/17 0045  WBC 9.0 8.7 8.6  HGB 8.2* 7.8* 7.5*  HCT 24.4* 23.7* 22.3*  PLT 185 185 165    Coag's Recent Labs  Lab 10/28/17 2315 10/30/17 0620  APTT 42*  --   INR 2.61 2.29    Sepsis Markers Recent Labs  Lab 10/30/17 0620  LATICACIDVEN 1.0    ABG Recent Labs  Lab 07/06/2017 0807 07/06/2017 1114 10/30/17 0422  PHART 7.360 7.383 7.417  PCO2ART 39.5 39.2 36.4  PO2ART 406.0* 425.0* 103    Liver Enzymes Recent Labs  Lab 10/28/17 2315 10/30/17 0620 10/31/17 0045  AST 21 19 17   ALT 13 11 9   ALKPHOS 58 42 44  BILITOT 0.8 0.8 0.7  ALBUMIN 4.0 2.8* 2.6*    Cardiac Enzymes Recent Labs  Lab 10/28/17 2315  TROPONINI <0.03    Glucose Recent Labs  Lab 10/28/17 2313 10/31/17 0400 10/31/17 0823  GLUCAP 102* 140* 109*    Imaging Dg Chest Port 1 View  Result Date: 10/31/2017 CLINICAL DATA:  Respiratory failure EXAM: PORTABLE CHEST 1 VIEW COMPARISON:  10/30/2017 FINDINGS: Endotracheal tube and feeding catheter are seen and in satisfactory position. Cardiac shadow is enlarged but stable. Flu lungs are well aerated bilaterally without focal infiltrate or sizable effusion. No acute bony abnormality is seen. IMPRESSION: No acute abnormality noted. Electronically Signed   By: Alcide CleverMark  Lukens M.D.   On: 10/31/2017 09:01     STUDIES:  MRI done; interpretation pending  CULTURES: None pending   ANTIBIOTICS: None  LINES/TUBES: ETT Art  line  DISCUSSION: 77 y.o. male with hx AF on coumadin, [facility] stroke like [facility].  Found to have evolving left MCA infarct.  [facility] and taken to IR where he had complete revascularization of occluded left ICA, MCA, ACA  ASSESSMENT / PLAN:  PULMONARY A: Becomes agitated during SBT trials per RN. Most recent PCXR with clear lung fields P:   Daily SBT  CARDIOVASCULAR A:  Hemodyn stable. Followed by vascular surg P:  Per VS  RENAL A:   AKI. Creat improving P:   Trend indices  GASTROINTESTINAL A:  No evidence acute GI path P:   GI prophylaxis TF  HEMATOLOGIC A:   [place] anemia - likely procedural related. No acute need for tx P:  Follow  NEUROLOGIC A:   S/p MCA infarct P:   F/u MRI results  Critical care time: 30 min  Pulmonary and Critical Care Medicine [facility] Pager: [phone]  10/31/2017, 1:06 PM   Addendum (7:40PM)  Spoke with D. Rinehaus PA of Neurology and the ultimate need of a central IV for continued hypertonic saline. As his INR remains elevated, despite not having received any warfarin this admission, will not place line tonight. INR is to be repeated in the AM. If still elevated and catheter is warranted, we can pharmacologically reverse the warfarin in order to perform the procedure.

## 2017-10-31 NOTE — Progress Notes (Signed)
PT Cancellation Note  Patient Details Name: Hunter Adkins MRN: [id number] DOB: 01-Feb-1941   Cancelled Treatment:    Reason Eval/Treat Not Completed: Patient not medically ready. Pt remains intubated and sedated. Pt bradycardic overnight with HR in the 30s.  PT to continue to follow and proceed with eval when medically appropriate.   Ilda FoilGarrow, [place] Spainhower Rene 10/31/2017, 8:59 AM   Aida RaiderWendy Alvis Pulcini, PT  Office # [phone] Pager [phone]

## 2017-10-31 NOTE — Progress Notes (Signed)
OT Cancellation Note  Patient Details Name: Hunter Adkins MRN: [name] DOB: 1941/03/02   Cancelled Treatment:    Reason Eval/Treat Not Completed: Medical issues which prohibited therapy. Pt remains intubated and sedated. Pt bradycardic overnight with HR in the 30s. RN requesting to hold therapy today. Will follow as schedule allows and proceed with eval when medically appropriate.    Jacques Fife M Keri Veale Carlei Huang MSOT, OTR/L Acute Rehab Pager: [phone] Office: [phone] 10/31/2017, 4:24 PM

## 2017-10-31 NOTE — Progress Notes (Signed)
eLink Physician-Brief Progress Note Patient Name: Hunter SeminoleJerry D Duhon DOB: 13-Feb-1941 MRN: [id number]   Date of Service  10/31/2017  HPI/Events of Note  Episodic bradycardia while asleep, no drop in blood pressure.Pt is not on any negative chronotropic agents. He is on a Phenylephrine infusion. Recent echo: moderate LV dysfunction-EF 40-50 %, extensive cardiac calcification around heart valves  eICU Interventions  Wean off Phenylephrine infusion, check electrolytes. Monitor for recurrence of bradycardia off Phenylephrine.        [name] 10/31/2017, 12:32 AM

## 2017-10-31 NOTE — Progress Notes (Signed)
SLP Cancellation Note  Patient Details Name: Hunter Adkins MRN: [id number] DOB: 01-16-1941   Cancelled treatment:       Reason Eval/Treat Not Completed: Medical issues which prohibited therapy. Pt intubated; will follow up when medically appropriate.  Rondel BatonMary Beth Prince Couey, [place]MS, CCC-SLP Speech-Language Pathologist [phone]    Hunter Adkins 10/31/2017, 4:09 PM

## 2017-10-31 NOTE — Progress Notes (Signed)
RT transported patient on ventilator from room 4N25 to MRI and back to room 4N25 with no complications. Vitals are stable. RT will continue to monitor.

## 2017-11-01 ENCOUNTER — Inpatient Hospital Stay (HOSPITAL_COMMUNITY): Payer: PPO

## 2017-11-01 DIAGNOSIS — Z66 Do not resuscitate: Secondary | ICD-10-CM

## 2017-11-01 DIAGNOSIS — Z7189 Other specified counseling: Secondary | ICD-10-CM

## 2017-11-01 DIAGNOSIS — Z515 Encounter for palliative care: Secondary | ICD-10-CM

## 2017-11-01 LAB — CBC
HCT: 22.1 % — ABNORMAL LOW (ref 39.0–52.0)
Hemoglobin: 7.2 g/dL — ABNORMAL LOW (ref 13.0–17.0)
MCH: 30.9 pg (ref 26.0–34.0)
MCHC: 32.6 g/dL (ref 30.0–36.0)
MCV: 94.8 fL (ref 78.0–100.0)
PLATELETS: 166 10*3/uL (ref 150–400)
RBC: 2.33 MIL/uL — AB (ref 4.22–5.81)
RDW: 14.1 % (ref 11.5–15.5)
WBC: 6.6 10*3/uL (ref 4.0–10.5)

## 2017-11-01 LAB — GLUCOSE, CAPILLARY
GLUCOSE-CAPILLARY: 105 mg/dL — AB (ref 70–99)
GLUCOSE-CAPILLARY: 108 mg/dL — AB (ref 70–99)
GLUCOSE-CAPILLARY: 115 mg/dL — AB (ref 70–99)
GLUCOSE-CAPILLARY: 116 mg/dL — AB (ref 70–99)
GLUCOSE-CAPILLARY: 125 mg/dL — AB (ref 70–99)
Glucose-Capillary: 103 mg/dL — ABNORMAL HIGH (ref 70–99)
Glucose-Capillary: 107 mg/dL — ABNORMAL HIGH (ref 70–99)
Glucose-Capillary: 117 mg/dL — ABNORMAL HIGH (ref 70–99)
Glucose-Capillary: 90 mg/dL (ref 70–99)
Glucose-Capillary: 102 mg/dL — ABNORMAL HIGH (ref 70–99)

## 2017-11-01 LAB — SODIUM
SODIUM: 151 mmol/L — AB (ref 135–145)
SODIUM: 153 mmol/L — AB (ref 135–145)
Sodium: 154 mmol/L — ABNORMAL HIGH (ref 135–145)
Sodium: 158 mmol/L — ABNORMAL HIGH (ref 135–145)

## 2017-11-01 LAB — BASIC METABOLIC PANEL
Anion gap: 5 (ref 5–15)
BUN: 12 mg/dL (ref 8–23)
CO2: 22 mmol/L (ref 22–32)
CREATININE: 0.95 mg/dL (ref 0.61–1.24)
Calcium: 7.6 mg/dL — ABNORMAL LOW (ref 8.9–10.3)
Chloride: 125 mmol/L — ABNORMAL HIGH (ref 98–111)
Glucose, Bld: 136 mg/dL — ABNORMAL HIGH (ref 70–99)
POTASSIUM: 3.7 mmol/L (ref 3.5–5.1)
SODIUM: 152 mmol/L — AB (ref 135–145)

## 2017-11-01 LAB — PROTIME-INR
INR: 1.75
Prothrombin Time: 20.3 seconds — ABNORMAL HIGH (ref 11.4–15.2)

## 2017-11-01 LAB — TRIGLYCERIDES: Triglycerides: 168 mg/dL — ABNORMAL HIGH (ref <150)

## 2017-11-01 MED ORDER — SODIUM CHLORIDE 3 % IV SOLN
INTRAVENOUS | Status: DC
  Administered 2017-11-01 – 2017-11-02 (×2): 75 mL/h via INTRAVENOUS
  Filled 2017-11-01 (×4): qty 500

## 2017-11-01 MED ORDER — GLYCOPYRROLATE 0.2 MG/ML IJ SOLN
0.4000 mg | Freq: Three times a day (TID) | INTRAMUSCULAR | Status: DC
  Administered 2017-11-01 – 2017-11-02 (×3): 0.4 mg via INTRAVENOUS
  Filled 2017-11-01 (×3): qty 2

## 2017-11-01 NOTE — Progress Notes (Signed)
Patient's wound vac has a leak. Tried to troubleshoot with Vinnie LangtonGretchen from [facility]. Called Dr. Darrick PennaFields at Vascular surgery. Fields ordered to change dressing to wet to dry and Vascular surgery will troubleshoot in the morning. RN will continue to monitor.

## 2017-11-01 NOTE — Progress Notes (Signed)
STROKE TEAM PROGRESS NOTE   SUBJECTIVE (INTERVAL HISTORY) His family at bedside, had family discussion with Dr. Danielle Dess. Poor prognosis likely not survivable family agreed on comfort measures and terminal extubation  OBJECTIVE Temp:  [96.2 F (35.7 C)-99.2 F (37.3 C)] 98.9 F (37.2 C) (08/04 0400) Pulse Rate:  [31-94] 86 (08/04 0600) Cardiac Rhythm: Atrial fibrillation (08/04 0400) Resp:  [15-30] 17 (08/04 0600) BP: (111-151)/(44-96) 122/75 (08/04 0600) SpO2:  [95 %-100 %] 95 % (08/04 0600) Arterial Line BP: (118-156)/(47-76) 152/68 (08/04 0500) FiO2 (%):  [30 %] 30 % (08/04 0337) Weight:  [164 lb 10.9 oz (74.7 kg)] 164 lb 10.9 oz (74.7 kg) (08/04 0500)  Recent Labs  Lab 10/31/17 1528 10/31/17 2039 10/31/17 2323 11/01/17 0458 11/01/17 0744  GLUCAP 112* 98 107* 117* 116*   Recent Labs  Lab 10/28/17 2315 15-Nov-2017 0807 10/30/17 0620 10/31/17 0045 10/31/17 1506 10/31/17 2115 11/01/17 0311 11/01/17 0510  NA 137 138 142 143 144 147* 151* 152*  K 3.5 4.2 3.4* 3.5  --   --   --  3.7  CL 105  --  112* 114*  --   --   --  125*  CO2 23  --  23 23  --   --   --  22  GLUCOSE 106*  --  102* 139*  --   --   --  136*  BUN 17  --  10 11  --   --   --  12  CREATININE 1.25*  --  1.22 1.04  --   --   --  0.95  CALCIUM 8.7*  --  7.8* 7.8*  --   --   --  7.6*  MG  --   --  1.8 2.2  --   --   --   --   PHOS  --   --  2.5  --   --   --   --   --    Recent Labs  Lab 10/28/17 2315 10/30/17 0620 10/31/17 0045  AST 21 19 17   ALT 13 11 9   ALKPHOS 58 42 44  BILITOT 0.8 0.8 0.7  PROT 7.5 5.2* 5.0*  ALBUMIN 4.0 2.8* 2.6*   Recent Labs  Lab 10/28/17 2315  11/15/17 1305 11-15-17 2034 10/30/17 0620 10/31/17 0045 11/01/17 0510  WBC 6.3  --   --  9.0 8.7 8.6 6.6  NEUTROABS 3.6  --   --   --  6.1  --   --   HGB 13.5   < > 8.9* 8.2* 7.8* 7.5* 7.2*  HCT 39.1*   < > 26.5* 24.4* 23.7* 22.3* 22.1*  MCV 88.6  --   --  92.1 92.6 93.3 94.8  PLT 232  --   --  185 185 165 166   < > =  values in this interval not displayed.   Recent Labs  Lab 10/28/17 2315 10/30/17 0620  CKTOTAL  --  55  TROPONINI <0.03  --    Recent Labs    10/30/17 0620 10/31/17 1506 11/01/17 0510  LABPROT 25.0* 23.1* 20.3*  INR 2.29 2.07 1.75   No results for input(s): COLORURINE, LABSPEC, PHURINE, GLUCOSEU, HGBUR, BILIRUBINUR, KETONESUR, PROTEINUR, UROBILINOGEN, NITRITE, LEUKOCYTESUR in the last 72 hours.  Invalid input(s): APPERANCEUR     Component Value Date/Time   CHOL 117 15-Nov-2017 0500   CHOL 126 01/27/2012 0329   TRIG 168 (H) 11/01/2017 0510   TRIG 64 01/27/2012 0329   HDL 37 (  L) 2017-11-07 0500   HDL 43 01/27/2012 0329   CHOLHDL 3.2 November 07, 2017 0500   VLDL 13 Nov 07, 2017 0500   VLDL 13 01/27/2012 0329   LDLCALC 67 07-Nov-2017 0500   LDLCALC 70 01/27/2012 0329   Lab Results  Component Value Date   HGBA1C 5.8 (H) 11/07/17      Component Value Date/Time   LABOPIA NONE DETECTED 11-07-2017 0730   COCAINSCRNUR NONE DETECTED 2017-11-07 0730   LABBENZ NONE DETECTED 2017-11-07 0730   AMPHETMU NONE DETECTED November 07, 2017 0730   THCU NONE DETECTED 11/07/2017 0730   LABBARB NONE DETECTED 2017/11/07 0730    Recent Labs  Lab 10/28/17 2315  ETH <10   IMAGING  I have personally reviewed the radiological images below and agree with the radiology interpretations.   CT Head WO Contrast 11/01/2017 IMPRESSION: 1. Left MCA infarct is stable in distribution from prior MRI of the head given differences in technique. 2. Stable petechial hemorrhage within the infarction. No new acute intracranial hemorrhage. 3. Stable to minimally increased mass effect with 6 mm left-to-right midline shift, uncal herniation, and partial effacement of left lateral ventricle.   MRI / MRA Head 10/31/2017 IMPRESSION: 1. Large acute left MCA infarct with extensive edema, 5 mm of rightward midline shift, and left uncal herniation. Associated petechial hemorrhage without malignant hemorrhagic  transformation. 2. Minimal intraventricular hemorrhage. 3. Additional small acute infarcts scattered throughout both cerebral hemispheres. 4. Chronic left basal ganglia and left thalamic lacunar infarcts. 5. Persistent patency of the intracranial left ICA and left MCA following recent revascularization. 6. Mild-to-moderate bilateral ICA stenoses.    Ct Angio Head W Or Wo Contrast  Ct Angio Neck W Or Wo Contrast Ct Cerebral Perfusion W Contrast 2017-11-07 IMPRESSION:  1. Acute EVLO with occlusion of the left ICA just distal to the bifurcation. Left ICA remains occluded to the terminus with absent flow within the left MCA artery. Fairly mild collateralization seen distally within the left MCA distribution.  2. Evidence for core infarct involving the left cerebral hemisphere as above. Surrounding ischemic penumbra with perfusion mismatch volume of 483 mL, suspected to at least in part be artifactual in nature on this examination. However, a fairly substantial surrounding penumbra is still somewhat suspected.  3. Patent azygos ACA.  4. Moderate atheromatous plaque throughout the right carotid siphon with moderate multifocal stenosis. No other high-grade or hemodynamically significant stenosis identified.  5. Emphysema.   Electronically Signed   By: Rise Mu M.D.   On: 11-07-17 00:56     Ct Head Code Stroke Wo Contrast 10/28/2017 IMPRESSION:  1. Asymmetric hyperdensity within the left M1 segment, concerning for large vessel occlusion. Subtle early hypodensity within the left cerebral hemisphere concerning for acute left MCA territory infarct. No acute intracranial hemorrhage.  2. ASPECTS is 7.    DSA endovascular revascularization of occluded Lt ICA distally ,Lt MCA and LT ACA with x 3 passes with tembotrap x 33mm retriever device and  X 1 pass with the 3mm x 20 mm treprovue retriever device achiving a TICI 2 b revascularization.   TTE  - Left ventricle: The cavity size  was normal. Wall thickness was   normal. Systolic function was mildly to moderately reduced. The   estimated ejection fraction was in the range of 40% to 45%. - Aortic valve: nodular calcification of leaflet tips. There was   mild regurgitation. Valve area (VTI): 1.77 cm^2. Valve area   (Vmax): 2.95 cm^2. Valve area (Vmean): 2.18 cm^2. - Mitral valve: Nodular  calcification of leaflet tips with   diastolic bowing moderate appearing MS. Severely calcified   annulus. Moderately thickened, moderately calcified leaflets .   There was mild regurgitation. Valve area by pressure half-time:   2.04 cm^2. Valve area by continuity equation (using LVOT flow):   0.84 cm^2. - Left atrium: The atrium was severely dilated. - Right atrium: The atrium was severely dilated. - Atrial septum: No defect or patent foramen ovale was identified. - Tricuspid valve: There was moderate regurgitation. - Pulmonary arteries: PA peak pressure: 41 mm Hg (S). - Pericardium, extracardiac: A trivial pericardial effusion was   identified posterior to the heart.     PHYSICAL EXAM  Temp:  [96.2 F (35.7 C)-99.2 F (37.3 C)] 98.9 F (37.2 C) (08/04 0400) Pulse Rate:  [31-94] 86 (08/04 0600) Resp:  [15-30] 17 (08/04 0600) BP: (111-151)/(44-96) 122/75 (08/04 0600) SpO2:  [95 %-100 %] 95 % (08/04 0600) Arterial Line BP: (118-156)/(47-76) 152/68 (08/04 0500) FiO2 (%):  [30 %] 30 % (08/04 0337) Weight:  [164 lb 10.9 oz (74.7 kg)] 164 lb 10.9 oz (74.7 kg) (08/04 0500)  General - Well nourished, well developed, intubated and sedated.  Ophthalmologic - fundi not visualized due to noncooperation.  Cardiovascular - irregularly irregular heart rate and rhythm.  Neuro - intubated on sedation, not following commands, not open eyes with voice or pain stimulation.  With eyelid force opening, eye left gaze preference, PERRL, sluggish doll's eyes, not blinking to visual threat bilaterally.  Absence of corneal reflexes, weak gag  and cough.  Facial asymmetry not able to tell due to ET tube.  On pain stimulation, moving left upper and lower extremities against gravities, localizing to pain.  However, right upper extremity 0/5 and right lower extremity 1/5.  DTR 1+, Babinski positive on the right. Sensation, coordination and gait not tested.    ASSESSMENT/PLAN Mr. Hunter Adkins is a 77 y.o. male with history of CHF, cardiomyopathy, A. fib on Coumadin, history of DVT, alcohol use admitted for aphasia, right-sided weakness, left gaze and right knee leg. No tPA given due to therapeutic INR.    Comfort measures, may terminally extubate  Stroke:  Left hemisphere infarct due to left ICA occlusion status post thrombectomy, embolic secondary to A. fib  Resultant intubated and sedated with right hemiplegia  CT head left MCA hyperdense sign, left MCA territory subtle infarct  CTA head and neck left ICA, MCA, and ACA occlusion, moderate right ICA bulb and siphon atherosclerosis  MRI and MRA pending  2D Echo EF 40 to 45%  LDL 67  HgbA1c 5.8  SCDs for VTE prophylaxis  warfarin daily prior to admission, now on No antithrombotic due to concerns of hemorrhagic conversion, recent surgery with bloody drainage and anemia. Pending MRI to decide.  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  Had emergent right femoral embolectomy  Had right common femoral artery enterectomy with vein patch angioplasty  Had right LE 4 compartment fasciotomies  Wound VAC at right LE  VVS on board  Afib on coumadin  INR 2.61, therapeutic  May consider switch to NOAC when appropriate for stroke prevention  Pending MRI to rule out hemorrhagic conversion before anticoagulation  Acute blood loss anemia  Hemoglobin 13.5-> 8.9->8.2->7.8  Likely related to current procedure and surgeries  Close CBC monitoring  PRBC  transfusion if less than 7.0  BP management Stable on the low  side On neo  BP goal 120-180 now 24 h post procedure  Hyperlipidemia  Home meds: Zocor  LDL 69, goal < 70  Now on Zocor 40  Continue statin at discharge  Dysphagia   Currently intubated   NPO  Will put cortrak and start TF  Other Stroke Risk Factors  Advanced age  Former cigarette smoker quit 6 years ago  ETOH use -on folic acid and B1  Cardiomyopathy - EF 40 to 45%  History of DVT  Other Active Problems  Elevated creatinine 1.25->1.22  Hospital day # 3  His family at bedside, had family discussion with Dr. Danielle DessElsner. Poor prognosis likely not survivable family agreed on comfort measures and terminal extubation  This patient is critically ill due to left hemispheric stroke, 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 fib, anemia and at significant risk of neurological worsening, death form recurrent stroke, hemorrhagic conversion, right leg ischemia, heart failure, hypovolemic shock. This patient's care requires constant monitoring of vital signs, hemodynamics, respiratory and cardiac monitoring, review of multiple databases, neurological assessment, discussion with family, other specialists and medical decision making of high complexity. I spent 45 minutes of neurocritical care time in the care of this patient.   Personally examined patient and images, and have participated in and made any corrections needed to history, physical, neuro exam,assessment and plan as stated above.  I have personally obtained the history, evaluated lab date, reviewed imaging studies and agree with radiology interpretations.          To contact Stroke Continuity provider, please refer to [email or web address]. After hours, contact General Neurology

## 2017-11-01 NOTE — Consult Note (Addendum)
                                                                                 Consultation Note Date: 11/01/2017   Patient Name: Hunter Adkins  DOB: 07/09/1940  MRN: [id number]  Age / Sex: 76 y.o., male  PCP: Hedrick, James, MD Referring Physician: Xu, Jindong, MD  Reason for Consultation: Establishing goals of care  HPI/Patient Profile: 76 y.o. male transferred to [facility] from [facility] on 11/10/2017 after presenting to their ED with complaints of right-sided weakness and sudden onset of aphasia while at a pool tournament with family. He has a past medical history significant for atrial fibrillation (Coumadin), CHF, COPD, DVT, cardiomyopathy, and alcohol abuse. According to family this event was witnessed on 10/28/17 at 2226. During ED course he was found to have mutism, left gaze preference, right hemianopsia, and right-sided hemiplegia. CT of head shoed evolving left MCA territory stroke. He had a therapeutic INR on Coumadin and not a TPA candidate. CT of head and neck showed left ICA occlusion at the origin as well as left M1 and left A1 occlusion. Since admission he underwent a thrombectomy by IR. He remains intubated with a substantial mass-effect and shift. Palliative Medicine team consulted for goals of care.   Clinical Assessment and Goals of Care: I have reviewed medical records including lab results, imaging, Epic notes, and MAR, received report from the bedside RN, and assessed the patient. I then met at the bedside with his wife, 2 daughters, son, 2 son-in-laws, brother, and 3 grand-children to discuss diagnosis prognosis, GOC, EOL wishes, disposition and options. Patient remains intubated with poor prognosis.   I introduced Palliative Medicine as specialized medical care for people living with serious illness. It focuses on providing relief from the symptoms and stress of a serious illness. The goal is to improve quality of life for both the patient and the family.  We  discussed a brief life review of the patient. Family reports patient was a very independent and caring man. He has been married to his wife for 54 years and they have 3 daughters and a son. He took care of his family and they meant everything to him. He enjoyed NASCAR, fishing, and vacationing at the beach. He also enjoyed playing pool. He is of Christian faith.   As far as functional and nutritional status prior to admission patient was independent of all ADLs. He was driving and cared for his wife and family. He was ambulatory. There were no concerns in regards to his health. Family states he had not had any recent complaints in regards to illness.   We discussed his current illness and what it means in the larger context of his on-going co-morbidities.  Natural disease trajectory and expectations at EOL were discussed. Family has been updated by all medical providers and aware that he would mostly likely not recover from this massive stroke and if he did he would have permanent and severe deficits. Wife and family tearful and verbalized that he would never want to live in his current state and they would not want to allow him to live that way.   I attempted to   elicit values and goals of care important to the patient.    The difference between aggressive medical intervention and comfort care was considered in light of the patient's goals of care. Family has decided that they would like to wait until tomorrow 8/5 to allow other family to visit and daughters to spend a little more time loving on him. Tomorrow they would like to proceed with full comfort care and have patient extubated. They are prepared to say their good byes and allow him to have a comfortable end-of-life transition. We discussed comfort care and what one-way extubation would look like. They understand after removing tubes etc. Patient will remain on continuous IV medications to manage symptoms such as pain, discomfort, shortness of breath,  and agitation. Family verbalized understanding and states this is what they would like to happen. They do not want him agitated or to feel any discomfort.   Advanced directives, concepts specific to code status, artifical feeding and hydration, and rehospitalization were considered and discussed. Family has expressed that they would like patient to be DNR. They were in agreement for this to be changed with wishes to be extubated on tomorrow. They are aware that if patient passed away while intubated staff would not intervene or provide heroic measures and allow patient to have natural death. Family verbalized understanding.    Questions and concerns were addressed.  The family was encouraged to call with questions or concerns.  PMT will continue to support holistically.  Primary Decision Maker:  NEXT OF KIN-Margaret Speigner (wife)     SUMMARY OF RECOMMENDATIONS    DNR-as requested by wife and family  Plan for one-way extubation tomorrow. Family verbalizes they are prepared for extubation as early as 8am in the morning. They are aware that it most likely may not occur at that time but after team has come together and are available family will be notified of timing by nursing staff. Family aware patient will have hospital death most likely.   Please contact me via AMION or at [phone] pgr when time has been organized.   Full comfort measures at the time of extubation at family's request.   Will need to initiate Morphine drip at least 31mn-1hr prior to extubation for comfort. Morphine 2-169mgtt. with bolus availability. Nursing may titrate for comfort and shortness of breath.   Robinul 0.4 mg every 8 hrs for excessive secretions. Will add 0.2 mg every 4 hrs PRN once extubated.   Will continue on current Propofol gtt after extubation in addition to morphine.   Neo, fentanyl, and feedings to be discontinued prior to extubation.   Palliative will continue to support patient, family, and  medical team during hospitalization.   Code Status/Advance Care Planning:  DNR  Palliative Prophylaxis:   Aspiration, Frequent Pain Assessment and Oral Care  Additional Recommendations (Limitations, Scope, Preferences):  Plan for one-way extubation on 8/5 with full comfort care  Psycho-social/Spiritual:   Desire for further Chaplaincy support:no  Prognosis:  Hours - Days - in the setting of massive ischemic stroke in left middle with substantial mass-effect and shift, intubation with plans to one-way extubate on 8/Nov 09, 2017 Discharge Planning: Anticipated Hospital Death      Primary Diagnoses: Present on Admission: . Acute ischemic stroke ([place]. Middle cerebral artery embolism, left   I have reviewed the medical record, interviewed the patient and family, and examined the patient. The following aspects are pertinent.  Past Medical History:  Diagnosis Date  . Anemia   . Cardiomyopathy (  [place])   . CHF (congestive heart failure) ([place])   . COPD (chronic obstructive pulmonary disease) ([place])   . Dysrhythmia    Atrial Fibrillation  . ETOH abuse   . GERD (gastroesophageal reflux disease)   . History of DVT of lower extremity   . History of Helicobacter pylori infection    Social History   Socioeconomic History  . Marital status: Married    Spouse name: Not on file  . Number of children: Not on file  . Years of education: Not on file  . Highest education level: Not on file  Occupational History  . Not on file  Social Needs  . Financial resource strain: Not on file  . Food insecurity:    Worry: Not on file    Inability: Not on file  . Transportation needs:    Medical: Not on file    Non-medical: Not on file  Tobacco Use  . Smoking status: Former Smoker    Last attempt to quit: 04/08/2011    Years since quitting: 6.5  . Smokeless tobacco: Current User    Types: Chew  Substance and Sexual Activity  . Alcohol use: Yes    Alcohol/week: 1.2 oz    Types: 2 Cans of  beer per week    Comment: binge daily  . Drug use: Not Currently  . Sexual activity: Not on file  Lifestyle  . Physical activity:    Days per week: Not on file    Minutes per session: Not on file  . Stress: Not on file  Relationships  . Social connections:    Talks on phone: Not on file    Gets together: Not on file    Attends religious service: Not on file    Active member of club or organization: Not on file    Attends meetings of clubs or organizations: Not on file    Relationship status: Not on file  Other Topics Concern  . Not on file  Social History Narrative  . Not on file   No family history on file. Scheduled Meds: .  stroke: mapping our early stages of recovery book   Does not apply Once  . sodium chloride   Intravenous Once  . chlorhexidine gluconate (MEDLINE KIT)  15 mL Mouth Rinse BID  . feeding supplement (PRO-STAT SUGAR FREE 64)  30 mL Per Tube Daily  . folic acid  1 mg Intravenous Daily  . furosemide  40 mg Intravenous Once  . mouth rinse  15 mL Mouth Rinse 10 times per day  . simvastatin  40 mg Per Tube q1800  . thiamine  100 mg Intravenous Daily   Continuous Infusions: . sodium chloride Stopped (10/31/17 2230)  . famotidine (PEPCID) IV 20 mg (11/01/17 0908)  . feeding supplement (VITAL AF 1.2 CAL) 50 mL/hr at 11/01/17 0600  . phenylephrine (NEO-SYNEPHRINE) Adult infusion Stopped (10/31/17 1100)  . potassium PHOSPHATE IVPB (in mmol)    . propofol (DIPRIVAN) infusion 50 mcg/kg/min (11/01/17 0620)  . sodium chloride (hypertonic) 75 mL/hr (11/01/17 0540)   PRN Meds:.acetaminophen **OR** acetaminophen (TYLENOL) oral liquid 160 mg/5 mL **OR** acetaminophen, albuterol, fentaNYL (SUBLIMAZE) injection, fentaNYL (SUBLIMAZE) injection, nitroGLYCERIN, senna-docusate Medications Prior to Admission:  Prior to Admission medications   Medication Sig Start Date End Date Taking? Authorizing Provider  carvedilol (COREG) 6.25 MG tablet Take 6.25 mg by mouth 2 (two) times  daily with a meal.   Yes [provider]  folic acid (FOLVITE) 1 MG tablet Take 1  mg by mouth daily.   Yes [provider]  furosemide (LASIX) 20 MG tablet Take 20 mg by mouth daily.   Yes [provider]  lisinopril (PRINIVIL,ZESTRIL) 2.5 MG tablet Take 2.5 mg by mouth daily.   Yes [provider]  omeprazole (PRILOSEC) 40 MG capsule Take 40 mg by mouth daily. 10/14/17  Yes [provider]  sildenafil (REVATIO) 20 MG tablet Take 20-100 mg by mouth daily as needed.    Yes [provider]  simvastatin (ZOCOR) 40 MG tablet Take 40 mg by mouth daily.   Yes [provider]  vitamin B-12 (CYANOCOBALAMIN) 1000 MCG tablet Take 1,000 mcg by mouth daily.   Yes [provider]  warfarin (COUMADIN) 5 MG tablet Take 5 mg by mouth daily.   Yes [provider]   No Known Allergies Review of Systems  Unable to perform ROS: Intubated    Physical Exam  Constitutional: He is intubated.  Critical   Cardiovascular: An irregular rhythm present. Exam reveals decreased pulses.  Pulmonary/Chest: He is intubated. He has decreased breath sounds.  Musculoskeletal:  S/p fasciotomy. Dressing dry and intact   Neurological:  Hemiplegia   Psychiatric: He is noncommunicative.  Nursing note and vitals reviewed.   Vital Signs: BP (!) 151/71   Pulse 90   Temp 97.6 F (36.4 C) (Axillary)   Resp (!) 27   Ht 5' 6" (1.676 m)   Wt 74.7 kg (164 lb 10.9 oz)   SpO2 99%   BMI 26.58 kg/m  Pain Scale: CPOT   Pain Score: 4    SpO2: SpO2: 99 % O2 Device:SpO2: 99 % O2 Flow Rate: .   IO: Intake/output summary:   Intake/Output Summary (Last 24 hours) at 11/01/2017 1035 Last data filed at 11/01/2017 0800 Gross per 24 hour  Intake 3109.66 ml  Output 1715 ml  Net 1394.66 ml    LBM: Last BM Date: 10/31/17 Baseline Weight: Weight: 70 kg (154 lb 5.2 oz) Most recent weight: Weight: 74.7 kg (164 lb 10.9 oz)     Palliative Assessment/Data:PPS  10 %   Time In: 1200 Time Out: 1315 Time Total: 75 min.   Greater than 50%  of this time was spent counseling and coordinating care related to the above assessment and plan.  Signed by:  Nikki Pickenpack-Cousar, NP-BC Palliative Medicine Team  Phone: [phone] Fax: [phone] Pager: [phone] Amion: N. Cousar    Please contact Palliative Medicine Team phone at [phone] for questions and concerns.  For individual provider: See Amion    Addendum on 01/28/18 to correct misinterpreted word documentation due to the use of voice recognition system of dragon dictation. Corrections made appropriately.          

## 2017-11-01 NOTE — Progress Notes (Signed)
Pt transported to CT and back with no problems.

## 2017-11-01 NOTE — Consult Note (Signed)
Reason for Consult: Surgical considerations for craniotomy for decompression Referring Physician: Dr. Luvenia Heller is an 77 y.o. male.  HPI: The patient is a 77 year old ambidextrous individual who had a massive ischemic stroke in the left middle cerebral distribution.  There is substantial mass-effect and shift.  The patient has been intubated.  He has been fully anticoagulated even at the time of his stroke.  He has had some complications of the anticoagulation with significant bleeding requiring fasciotomy in the right lower extremity.  He has been incapacitated and is comatose though when medications are discontinued he does move vigorously with his left side.  He will buck the ventilator.  Past Medical History:  Diagnosis Date  . Anemia   . Cardiomyopathy ([place])   . CHF (congestive heart failure) ([place])   . COPD (chronic obstructive pulmonary disease) ([place])   . Dysrhythmia    Atrial Fibrillation  . ETOH abuse   . GERD (gastroesophageal reflux disease)   . History of DVT of lower extremity   . History of Helicobacter pylori infection     Past Surgical History:  Procedure Laterality Date  . CARDIAC CATHETERIZATION    . COLONOSCOPY W/ POLYPECTOMY    . COLONOSCOPY WITH PROPOFOL N/A 10/14/2017   Procedure: COLONOSCOPY WITH PROPOFOL;  Surgeon: Manya Silvas, MD;  Location: [facility] ENDOSCOPY;  Service: Endoscopy;  Laterality: N/A;  . ESOPHAGOGASTRODUODENOSCOPY (EGD) WITH PROPOFOL N/A 10/14/2017   Procedure: ESOPHAGOGASTRODUODENOSCOPY (EGD) WITH PROPOFOL;  Surgeon: Manya Silvas, MD;  Location: [facility] ENDOSCOPY;  Service: Endoscopy;  Laterality: N/A;  . RADIOLOGY WITH ANESTHESIA N/A 11/26/2017   Procedure: IR WITH ANESTHESIA;  Surgeon: Luanne Bras, MD;  Location: [place];  Service: Radiology;  Laterality: N/A;    No family history on file.  Social History:  reports that he quit smoking about 6 years ago. His smokeless tobacco use includes chew. He reports that he drinks  about 1.2 oz of alcohol per week. He reports that he has current or past drug history.  Allergies: No Known Allergies  Medications: I have reviewed the patient's current medications.  Results for orders placed or performed during the hospital encounter of 11/15/2017 (from the past 48 hour(s))  Glucose, capillary     Status: Abnormal   Collection Time: 10/30/17  4:27 PM  Result Value Ref Range   Glucose-Capillary 115 (H) 70 - 99 mg/dL  Glucose, capillary     Status: Abnormal   Collection Time: 10/30/17  7:31 PM  Result Value Ref Range   Glucose-Capillary 103 (H) 70 - 99 mg/dL  Glucose, capillary     Status: Abnormal   Collection Time: 10/31/17 12:03 AM  Result Value Ref Range   Glucose-Capillary 125 (H) 70 - 99 mg/dL  CBC     Status: Abnormal   Collection Time: 10/31/17 12:45 AM  Result Value Ref Range   WBC 8.6 4.0 - 10.5 K/uL   RBC 2.39 (L) 4.22 - 5.81 MIL/uL   Hemoglobin 7.5 (L) 13.0 - 17.0 g/dL   HCT 22.3 (L) 39.0 - 52.0 %   MCV 93.3 78.0 - 100.0 fL   MCH 31.4 26.0 - 34.0 pg   MCHC 33.6 30.0 - 36.0 g/dL   RDW 13.4 11.5 - 15.5 %   Platelets 165 150 - 400 K/uL    Comment: Performed at [facility], [place] [address] [place], [place] [address]  Magnesium     Status: None   Collection Time: 10/31/17 12:45 AM  Result Value  Ref Range   Magnesium 2.2 1.7 - 2.4 mg/dL    Comment: Performed at [facility] [address] [place], [place] [address]  Comprehensive metabolic panel     Status: Abnormal   Collection Time: 10/31/17 12:45 AM  Result Value Ref Range   Sodium 143 135 - 145 mmol/L   Potassium 3.5 3.5 - 5.1 mmol/L   Chloride 114 (H) 98 - 111 mmol/L   CO2 23 22 - 32 mmol/L   Glucose, Bld 139 (H) 70 - 99 mg/dL   BUN 11 8 - 23 mg/dL   Creatinine, Ser 1.04 0.61 - 1.24 mg/dL   Calcium 7.8 (L) 8.9 - 10.3 mg/dL   Total Protein 5.0 (L) 6.5 - 8.1 g/dL   Albumin 2.6 (L) 3.5 - 5.0 g/dL   AST 17 15 - 41 U/L   ALT 9 0 - 44 U/L   Alkaline Phosphatase 44 38 - 126 U/L    Total Bilirubin 0.7 0.3 - 1.2 mg/dL   GFR calc non Af Amer >60 >60 mL/min   GFR calc Af Amer >60 >60 mL/min    Comment: (NOTE) The eGFR has been calculated using the CKD EPI equation. This calculation has not been validated in all clinical situations. eGFR's persistently <60 mL/min signify possible Chronic Kidney Disease.    Anion gap 6 5 - 15    Comment: Performed at [facility] [address], [place], [place] [address]  Glucose, capillary     Status: Abnormal   Collection Time: 10/31/17  4:00 AM  Result Value Ref Range   Glucose-Capillary 140 (H) 70 - 99 mg/dL  Glucose, capillary     Status: Abnormal   Collection Time: 10/31/17  8:23 AM  Result Value Ref Range   Glucose-Capillary 109 (H) 70 - 99 mg/dL  Sodium     Status: None   Collection Time: 10/31/17  3:06 PM  Result Value Ref Range   Sodium 144 135 - 145 mmol/L    Comment: Performed at [facility], [place] [address], [place], [place] [address]  Protime-INR     Status: Abnormal   Collection Time: 10/31/17  3:06 PM  Result Value Ref Range   Prothrombin Time 23.1 (H) 11.4 - 15.2 seconds   INR 2.07     Comment: Performed at [facility], [place] [address], [place], [place] [address]  Glucose, capillary     Status: Abnormal   Collection Time: 10/31/17  3:28 PM  Result Value Ref Range   Glucose-Capillary 112 (H) 70 - 99 mg/dL  Glucose, capillary     Status: None   Collection Time: 10/31/17  8:39 PM  Result Value Ref Range   Glucose-Capillary 98 70 - 99 mg/dL  Sodium     Status: Abnormal   Collection Time: 10/31/17  9:15 PM  Result Value Ref Range   Sodium 147 (H) 135 - 145 mmol/L    Comment: Performed at [facility], [place] [address] [place], [place] [address]  Glucose, capillary     Status: Abnormal   Collection Time: 10/31/17 11:23 PM  Result Value Ref Range   Glucose-Capillary 107 (H) 70 - 99 mg/dL  Sodium     Status: Abnormal   Collection Time: 11/01/17  3:11 AM  Result Value Ref Range    Sodium 151 (H) 135 - 145 mmol/L    Comment: Performed at [facility], [place] [address], [place], [place] [address]  Glucose,  capillary     Status: Abnormal   Collection Time: 11/01/17  4:58 AM  Result Value Ref Range   Glucose-Capillary 117 (H) 70 - 99 mg/dL  CBC     Status: Abnormal   Collection Time: 11/01/17  5:10 AM  Result Value Ref Range   WBC 6.6 4.0 - 10.5 K/uL   RBC 2.33 (L) 4.22 - 5.81 MIL/uL   Hemoglobin 7.2 (L) 13.0 - 17.0 g/dL   HCT 22.1 (L) 39.0 - 52.0 %   MCV 94.8 78.0 - 100.0 fL   MCH 30.9 26.0 - 34.0 pg   MCHC 32.6 30.0 - 36.0 g/dL   RDW 14.1 11.5 - 15.5 %   Platelets 166 150 - 400 K/uL    Comment: Performed at [facility], [place] [address], [place], [place] [address]  Basic metabolic panel     Status: Abnormal   Collection Time: 11/01/17  5:10 AM  Result Value Ref Range   Sodium 152 (H) 135 - 145 mmol/L   Potassium 3.7 3.5 - 5.1 mmol/L   Chloride 125 (H) 98 - 111 mmol/L   CO2 22 22 - 32 mmol/L   Glucose, Bld 136 (H) 70 - 99 mg/dL   BUN 12 8 - 23 mg/dL   Creatinine, Ser 0.95 0.61 - 1.24 mg/dL   Calcium 7.6 (L) 8.9 - 10.3 mg/dL   GFR calc non Af Amer >60 >60 mL/min   GFR calc Af Amer >60 >60 mL/min    Comment: (NOTE) The eGFR has been calculated using the CKD EPI equation. This calculation has not been validated in all clinical situations. eGFR's persistently <60 mL/min signify possible Chronic Kidney Disease.    Anion gap 5 5 - 15    Comment: Performed at [facility] [address], [place], [place] [address]  Protime-INR     Status: Abnormal   Collection Time: 11/01/17  5:10 AM  Result Value Ref Range   Prothrombin Time 20.3 (H) 11.4 - 15.2 seconds   INR 1.75     Comment: Performed at [facility] [address] [place], [place] [address]  Triglycerides     Status: Abnormal   Collection Time: 11/01/17  5:10 AM  Result Value Ref Range   Triglycerides 168 (H) <150 mg/dL    Comment: Performed at [facility] [address] [place], [place] [address]  Glucose, capillary     Status: Abnormal   Collection Time: 11/01/17  7:44 AM  Result Value Ref Range   Glucose-Capillary 116 (H) 70 - 99 mg/dL  Sodium     Status: Abnormal   Collection Time: 11/01/17  9:20 AM  Result Value Ref Range   Sodium 154 (H) 135 - 145 mmol/L    Comment: Performed at [facility] [address] [place], [place] [address]    Ct Head Wo Contrast  Result Date: 11/01/2017 CLINICAL DATA:  77 y/o  M; 77 y/o  M; stroke for follow-up. EXAM: CT HEAD WITHOUT CONTRAST TECHNIQUE: Contiguous axial images were obtained from the base of the skull through the vertex without intravenous contrast. COMPARISON:  10/31/2017 MRI head. 11/15/2017 CTA head. 10/28/2017 CT head. FINDINGS: Brain: Left MCA large infarction is stable in distribution in comparison with prior MRI of the head given differences in technique involving frontal and temporal lobes, insula, and basal ganglia. Subcentimeter foci of petechial hemorrhage within the hemorrhage are also stable. There is extensive edema with 6 mm of  left-to-right midline shift, uncal herniation, and effacement of the left lateral ventricle which is stable to minimally increased from the prior MRI. No new large acute stroke, hemorrhage, or mass effect is identified. Vascular: Calcific atherosclerosis of carotid siphons. Skull: Normal. Negative for fracture or focal lesion. Sinuses/Orbits: Right ethmoid and sphenoid sinus mucosal thickening. Chronic inflammatory changes of the walls of right sphenoid sinus. Other: Fall patient is intubated with nasoenteric tube, partially visualized. IMPRESSION: 1. Left MCA infarct is stable in distribution from prior MRI of the head given differences in technique. 2. Stable petechial hemorrhage within the infarction. No new acute intracranial hemorrhage. 3. Stable to minimally increased mass effect with 6 mm left-to-right midline shift, uncal herniation, and partial effacement of  left lateral ventricle. Electronically Signed   By: Kristine Garbe M.D.   On: 11/01/2017 02:09   Mr Jodene Nam Head Wo Contrast  Result Date: 10/31/2017 CLINICAL DATA:  Stroke follow-up. Left ICA and left MCA occlusion status post endovascular revascularization. EXAM: MRI HEAD WITHOUT CONTRAST MRA HEAD WITHOUT CONTRAST TECHNIQUE: Multiplanar, multiecho pulse sequences of the brain and surrounding structures were obtained without intravenous contrast. Angiographic images of the head were obtained using MRA technique without contrast. COMPARISON:  Head CT 10/28/2017, CTA 11/24/2017, and cerebral angiogram 11/28/2017 FINDINGS: MRI HEAD FINDINGS Brain: There is a large acute left MCA infarct involving much of the left frontal and left temporal lobes, insula, and basal ganglia. There is milder involvement of the left parietal lobe. Small acute infarcts are also present in the left occipital lobe and scattered throughout the right cerebral hemisphere. There is prominent cytotoxic edema associated with the left MCA infarct resulting in regional sulcal effacement, partial effacement of the left lateral ventricle, and 5 mm of rightward midline shift. There is mild left uncal herniation. Scattered petechial hemorrhages are noted within the infarct. There is also minimal blood in the occipital horns of both lateral ventricles. No ventricular dilatation is seen to suggest acute hydrocephalus. There is no extra-axial fluid collection. Chronic lacunar infarcts are noted in the left thalamus and left caudate. There is mild cerebral atrophy. Vascular: Major intracranial vascular flow voids are preserved. Skull and upper cervical spine: Unremarkable bone marrow signal. Sinuses/Orbits: Bilateral cataract extraction. Mild ethmoid and sphenoid sinus mucosal thickening. Clear mastoid air cells. Other: None. MRA HEAD FINDINGS The visualized distal vertebral arteries are widely patent to the basilar with the left being mildly  dominant. Patent PICA and SCA origins are visualized bilaterally. The basilar artery is widely patent. There are large posterior communicating arteries bilaterally. The PCAs are patent without evidence of significant stenosis. The internal carotid arteries are patent from skull base to carotid termini with mild-to-moderate cavernous and proximal supraclinoid atherosclerotic narrowing bilaterally. MCAs are patent without evidence of significant M1 stenosis. There is asymmetric attenuation of right MCA superior division distal branch vessels. The ACAs are patent with suggestion of mild proximal right A1 and mild distal left A1 stenosis. An azygos ACA configuration is again noted. IMPRESSION: 1. Large acute left MCA infarct with extensive edema, 5 mm of rightward midline shift, and left uncal herniation. Associated petechial hemorrhage without malignant hemorrhagic transformation. 2. Minimal intraventricular hemorrhage. 3. Additional small acute infarcts scattered throughout both cerebral hemispheres. 4. Chronic left basal ganglia and left thalamic lacunar infarcts. 5. Persistent patency of the intracranial left ICA and left MCA following recent revascularization. 6. Mild-to-moderate bilateral ICA stenoses. Critical Value/emergent results were called by telephone at the time of interpretation on 10/31/2017 at 2:04 pm to PA  Lynwood Dawley, who verbally acknowledged these results. Electronically Signed   By: Logan Bores M.D.   On: 10/31/2017 14:08   Mr Brain Wo Contrast  Result Date: 10/31/2017 CLINICAL DATA:  Stroke follow-up. Left ICA and left MCA occlusion status post endovascular revascularization. EXAM: MRI HEAD WITHOUT CONTRAST MRA HEAD WITHOUT CONTRAST TECHNIQUE: Multiplanar, multiecho pulse sequences of the brain and surrounding structures were obtained without intravenous contrast. Angiographic images of the head were obtained using MRA technique without contrast. COMPARISON:  Head CT 10/28/2017, CTA  11/23/2017, and cerebral angiogram 11/03/2017 FINDINGS: MRI HEAD FINDINGS Brain: There is a large acute left MCA infarct involving much of the left frontal and left temporal lobes, insula, and basal ganglia. There is milder involvement of the left parietal lobe. Small acute infarcts are also present in the left occipital lobe and scattered throughout the right cerebral hemisphere. There is prominent cytotoxic edema associated with the left MCA infarct resulting in regional sulcal effacement, partial effacement of the left lateral ventricle, and 5 mm of rightward midline shift. There is mild left uncal herniation. Scattered petechial hemorrhages are noted within the infarct. There is also minimal blood in the occipital horns of both lateral ventricles. No ventricular dilatation is seen to suggest acute hydrocephalus. There is no extra-axial fluid collection. Chronic lacunar infarcts are noted in the left thalamus and left caudate. There is mild cerebral atrophy. Vascular: Major intracranial vascular flow voids are preserved. Skull and upper cervical spine: Unremarkable bone marrow signal. Sinuses/Orbits: Bilateral cataract extraction. Mild ethmoid and sphenoid sinus mucosal thickening. Clear mastoid air cells. Other: None. MRA HEAD FINDINGS The visualized distal vertebral arteries are widely patent to the basilar with the left being mildly dominant. Patent PICA and SCA origins are visualized bilaterally. The basilar artery is widely patent. There are large posterior communicating arteries bilaterally. The PCAs are patent without evidence of significant stenosis. The internal carotid arteries are patent from skull base to carotid termini with mild-to-moderate cavernous and proximal supraclinoid atherosclerotic narrowing bilaterally. MCAs are patent without evidence of significant M1 stenosis. There is asymmetric attenuation of right MCA superior division distal branch vessels. The ACAs are patent with suggestion of  mild proximal right A1 and mild distal left A1 stenosis. An azygos ACA configuration is again noted. IMPRESSION: 1. Large acute left MCA infarct with extensive edema, 5 mm of rightward midline shift, and left uncal herniation. Associated petechial hemorrhage without malignant hemorrhagic transformation. 2. Minimal intraventricular hemorrhage. 3. Additional small acute infarcts scattered throughout both cerebral hemispheres. 4. Chronic left basal ganglia and left thalamic lacunar infarcts. 5. Persistent patency of the intracranial left ICA and left MCA following recent revascularization. 6. Mild-to-moderate bilateral ICA stenoses. Critical Value/emergent results were called by telephone at the time of interpretation on 10/31/2017 at 2:04 pm to [place], who verbally acknowledged these results. Electronically Signed   By: Logan Bores M.D.   On: 10/31/2017 14:08   Dg Chest Port 1 View  Result Date: 10/31/2017 CLINICAL DATA:  Respiratory failure EXAM: PORTABLE CHEST 1 VIEW COMPARISON:  10/30/2017 FINDINGS: Endotracheal tube and feeding catheter are seen and in satisfactory position. Cardiac shadow is enlarged but stable. Flu lungs are well aerated bilaterally without focal infiltrate or sizable effusion. No acute bony abnormality is seen. IMPRESSION: No acute abnormality noted. Electronically Signed   By: Inez Catalina M.D.   On: 10/31/2017 09:01    Review of Systems  Unable to perform ROS: Critical illness   Blood pressure 124/61, pulse 70, temperature 97.6  F (36.4 C), temperature source Axillary, resp. rate (!) 22, height _0  (1.676 m), weight 74.7 kg (164 lb 10.9 oz), SpO2 100 %. Physical Exam  Constitutional: He appears well-developed and well-nourished.  Neurological:  The patient is intubated and sedated.  Pupils are 2 mm equally round and reactive at this time.  There is a weak flicker of movement on Babinski testing on the right lower extremity.  There is some shrugging of the shoulders  deep central pain.  This occurs only on the left side.    Assessment/Plan: Large middle cerebral distribution stroke on the left hemisphere.  Even if the patient is codominant he will likely have significant deficits with communication if he were to survive however I do not believe that the patient is likely to survive this massive stroke with or without surgery.  I have recommended strongly against any surgical consideration noting that it would be severely traumatic and not likely to improve his capacity for survival or the quality of that survival if it were to be performed.  Alexandrina Fiorini J 11/01/2017, 11:37 AM

## 2017-11-01 NOTE — Progress Notes (Signed)
PULMONARY / CRITICAL CARE MEDICINE   Name: Hunter Adkins MRN: [id number] DOB: [birth year]    ADMISSION DATE:  11/16/2017 CONSULTATION DATE:  11/01/2017  REFERRING MD:  Wilford Corner  CHIEF COMPLAINT:  AMS  HISTORY OF PRESENT ILLNESS:   Hunter Adkins a 77 y.o.maleformer smoker quit 2013with PMH as outlined below including but not limited to AF on coumadin, CHF, COPD, DVT, EtOH abuse. He was in his usual state of health evening of 7/31 and was at a bar at a pool tournament when he had sudden onset of aphasia and right sided weakness around 2226. He was taken to [facility] ED where he was found to have mutism, left gaze preference, right hemianopsia, right hemiplegia. CT of head showed evolving left MCA infarct and CTA showed left ICA occlusion and left M1 and A1 occlusion. He was deemed to not be a candidate for tPA due to being therapeutic on coumadin. He was subsequently transferred to [facility] where he was taken to IR and had left common carotid arteriogram followed by complete revascularization of occluded left ICA, left MCA, left ACA.  Post procedure, he returned to the ICU on vent and PCCM was asked to assist with vent management.  Of note, he was also found to have cold RLE prior to IR procedure with no palpable pulses. Vascular had been consulted saw patient 11/10/2017 am.  PAST MEDICAL HISTORY :  He  has a past medical history of Anemia, Cardiomyopathy (HCC), CHF (congestive heart failure) (HCC), COPD (chronic obstructive pulmonary disease) (HCC), Dysrhythmia, ETOH abuse, GERD (gastroesophageal reflux disease), History of DVT of lower extremity, and History of Helicobacter pylori infection.  PAST SURGICAL HISTORY: He  has a past surgical history that includes Colonoscopy w/ polypectomy; Cardiac catheterization; Colonoscopy with propofol (N/A, 10/14/2017); Esophagogastroduodenoscopy (egd) with propofol (N/A, 10/14/2017); and Radiology with anesthesia (N/A, 11/14/2017).  No Known Allergies  No  current facility-administered medications on file prior to encounter.    Current Outpatient Medications on File Prior to Encounter  Medication Sig  . carvedilol (COREG) 6.25 MG tablet Take 6.25 mg by mouth 2 (two) times daily with a meal.  . folic acid (FOLVITE) 1 MG tablet Take 1 mg by mouth daily.  . furosemide (LASIX) 20 MG tablet Take 20 mg by mouth daily.  Marland Kitchen lisinopril (PRINIVIL,ZESTRIL) 2.5 MG tablet Take 2.5 mg by mouth daily.  Marland Kitchen omeprazole (PRILOSEC) 40 MG capsule Take 40 mg by mouth daily.  . sildenafil (REVATIO) 20 MG tablet Take 20-100 mg by mouth daily as needed.   . simvastatin (ZOCOR) 40 MG tablet Take 40 mg by mouth daily.  . vitamin B-12 (CYANOCOBALAMIN) 1000 MCG tablet Take 1,000 mcg by mouth daily.  Marland Kitchen warfarin (COUMADIN) 5 MG tablet Take 5 mg by mouth daily.    FAMILY HISTORY:  His family history is not on file.  SOCIAL HISTORY: He  reports that he quit smoking about 6 years ago. His smokeless tobacco use includes chew. He reports that he drinks about 1.2 oz of alcohol per week. He reports that he has current or past drug history.  SUBJECTIVE:  Sedated on propofol  VITAL SIGNS: BP (!) 151/71   Pulse 90   Temp 97.6 F (36.4 C) (Axillary)   Resp (!) 27   Ht 5\' 6"  (1.676 m)   Wt 74.7 kg (164 lb 10.9 oz)   SpO2 99%   BMI 26.58 kg/m   HEMODYNAMICS:    VENTILATOR SETTINGS: Vent Mode: PSV;CPAP FiO2 (%):  [30 %] 30 %  Set Rate:  [16 bmp] 16 bmp Vt Set:  [500 mL-510 mL] 510 mL PEEP:  [5 cmH20] 5 cmH20 Pressure Support:  [8 cmH20] 8 cmH20 Plateau Pressure:  [15 cmH20-22 cmH20] 22 cmH20  INTAKE / OUTPUT: I/O last 3 completed shifts: In: 5412.7 [I.V.:3505.6; NG/GT:1750; IV Piggyback:157.1] Out: 2455 [Urine:2000; Drains:455]  PHYSICAL EXAMINATION: General:  WD/WN with no obvious distress while sedated Neuro:  R hemiplegia HEENT:  [place]/AT; PRRL; ETT/FT in place Cardiovascular:  Irreg/irreg with occasional extrasystole Lungs:  Clear Abdomen:  Soft, no  guarding, +BS Musculoskeletal:  Dressing on fasciotomy wound Skin:  No edema  LABS:  BMET Recent Labs  Lab 10/30/17 0620 10/31/17 0045  11/01/17 0311 11/01/17 0510 11/01/17 0920  NA 142 143   < > 151* 152* 154*  K 3.4* 3.5  --   --  3.7  --   CL 112* 114*  --   --  125*  --   CO2 23 23  --   --  22  --   BUN 10 11  --   --  12  --   CREATININE 1.22 1.04  --   --  0.95  --   GLUCOSE 102* 139*  --   --  136*  --    < > = values in this interval not displayed.    Electrolytes Recent Labs  Lab 10/30/17 0620 10/31/17 0045 11/01/17 0510  CALCIUM 7.8* 7.8* 7.6*  MG 1.8 2.2  --   PHOS 2.5  --   --     CBC Recent Labs  Lab 10/30/17 0620 10/31/17 0045 11/01/17 0510  WBC 8.7 8.6 6.6  HGB 7.8* 7.5* 7.2*  HCT 23.7* 22.3* 22.1*  PLT 185 165 166    Coag's Recent Labs  Lab 10/28/17 2315 10/30/17 0620 10/31/17 1506 11/01/17 0510  APTT 42*  --   --   --   INR 2.61 2.29 2.07 1.75    Sepsis Markers Recent Labs  Lab 10/30/17 0620  LATICACIDVEN 1.0    ABG Recent Labs  Lab 15-May-2017 0807 15-May-2017 1114 10/30/17 0422  PHART 7.360 7.383 7.417  PCO2ART 39.5 39.2 36.4  PO2ART 406.0* 425.0* 103    Liver Enzymes Recent Labs  Lab 10/28/17 2315 10/30/17 0620 10/31/17 0045  AST 21 19 17   ALT 13 11 9   ALKPHOS 58 42 44  BILITOT 0.8 0.8 0.7  ALBUMIN 4.0 2.8* 2.6*    Cardiac Enzymes Recent Labs  Lab 10/28/17 2315  TROPONINI <0.03    Glucose Recent Labs  Lab 10/31/17 0823 10/31/17 1528 10/31/17 2039 10/31/17 2323 11/01/17 0458 11/01/17 0744  GLUCAP 109* 112* 98 107* 117* 116*    Imaging Ct Head Wo Contrast  Result Date: 11/01/2017 CLINICAL DATA:  77 y/o  M; 77 y/o  M; stroke for follow-up. EXAM: CT HEAD WITHOUT CONTRAST TECHNIQUE: Contiguous axial images were obtained from the base of the skull through the vertex without intravenous contrast. COMPARISON:  10/31/2017 MRI head. 11/15/2017 CTA head. 10/28/2017 CT head. FINDINGS: Brain: Left MCA  large infarction is stable in distribution in comparison with prior MRI of the head given differences in technique involving frontal and temporal lobes, insula, and basal ganglia. Subcentimeter foci of petechial hemorrhage within the hemorrhage are also stable. There is extensive edema with 6 mm of left-to-right midline shift, uncal herniation, and effacement of the left lateral ventricle which is stable to minimally increased from the prior MRI. No new large acute stroke, hemorrhage, or mass effect is  identified. Vascular: Calcific atherosclerosis of carotid siphons. Skull: Normal. Negative for fracture or focal lesion. Sinuses/Orbits: Right ethmoid and sphenoid sinus mucosal thickening. Chronic inflammatory changes of the walls of right sphenoid sinus. Other: Fall patient is intubated with nasoenteric tube, partially visualized. IMPRESSION: 1. Left MCA infarct is stable in distribution from prior MRI of the head given differences in technique. 2. Stable petechial hemorrhage within the infarction. No new acute intracranial hemorrhage. 3. Stable to minimally increased mass effect with 6 mm left-to-right midline shift, uncal herniation, and partial effacement of left lateral ventricle. Electronically Signed   By: Mitzi Hansen M.D.   On: 11/01/2017 02:09   Mr Maxine Glenn Head Wo Contrast  Result Date: 10/31/2017 CLINICAL DATA:  Stroke follow-up. Left ICA and left MCA occlusion status post endovascular revascularization. EXAM: MRI HEAD WITHOUT CONTRAST MRA HEAD WITHOUT CONTRAST TECHNIQUE: Multiplanar, multiecho pulse sequences of the brain and surrounding structures were obtained without intravenous contrast. Angiographic images of the head were obtained using MRA technique without contrast. COMPARISON:  Head CT 10/28/2017, CTA November 17, 2017, and cerebral angiogram November 17, 2017 FINDINGS: MRI HEAD FINDINGS Brain: There is a large acute left MCA infarct involving much of the left frontal and left temporal lobes,  insula, and basal ganglia. There is milder involvement of the left parietal lobe. Small acute infarcts are also present in the left occipital lobe and scattered throughout the right cerebral hemisphere. There is prominent cytotoxic edema associated with the left MCA infarct resulting in regional sulcal effacement, partial effacement of the left lateral ventricle, and 5 mm of rightward midline shift. There is mild left uncal herniation. Scattered petechial hemorrhages are noted within the infarct. There is also minimal blood in the occipital horns of both lateral ventricles. No ventricular dilatation is seen to suggest acute hydrocephalus. There is no extra-axial fluid collection. Chronic lacunar infarcts are noted in the left thalamus and left caudate. There is mild cerebral atrophy. Vascular: Major intracranial vascular flow voids are preserved. Skull and upper cervical spine: Unremarkable bone marrow signal. Sinuses/Orbits: Bilateral cataract extraction. Mild ethmoid and sphenoid sinus mucosal thickening. Clear mastoid air cells. Other: None. MRA HEAD FINDINGS The visualized distal vertebral arteries are widely patent to the basilar with the left being mildly dominant. Patent PICA and SCA origins are visualized bilaterally. The basilar artery is widely patent. There are large posterior communicating arteries bilaterally. The PCAs are patent without evidence of significant stenosis. The internal carotid arteries are patent from skull base to carotid termini with mild-to-moderate cavernous and proximal supraclinoid atherosclerotic narrowing bilaterally. MCAs are patent without evidence of significant M1 stenosis. There is asymmetric attenuation of right MCA superior division distal branch vessels. The ACAs are patent with suggestion of mild proximal right A1 and mild distal left A1 stenosis. An azygos ACA configuration is again noted. IMPRESSION: 1. Large acute left MCA infarct with extensive edema, 5 mm of  rightward midline shift, and left uncal herniation. Associated petechial hemorrhage without malignant hemorrhagic transformation. 2. Minimal intraventricular hemorrhage. 3. Additional small acute infarcts scattered throughout both cerebral hemispheres. 4. Chronic left basal ganglia and left thalamic lacunar infarcts. 5. Persistent patency of the intracranial left ICA and left MCA following recent revascularization. 6. Mild-to-moderate bilateral ICA stenoses. Critical Value/emergent results were called by telephone at the time of interpretation on 10/31/2017 at 2:04 pm to PA [facility], who verbally acknowledged these results. Electronically Signed   By: Sebastian Ache M.D.   On: 10/31/2017 14:08   Mr Brain Wo Contrast  Result Date: 10/31/2017 CLINICAL  DATA:  Stroke follow-up. Left ICA and left MCA occlusion status post endovascular revascularization. EXAM: MRI HEAD WITHOUT CONTRAST MRA HEAD WITHOUT CONTRAST TECHNIQUE: Multiplanar, multiecho pulse sequences of the brain and surrounding structures were obtained without intravenous contrast. Angiographic images of the head were obtained using MRA technique without contrast. COMPARISON:  Head CT 10/28/2017, CTA 11/06/17, and cerebral angiogram Nov 06, 2017 FINDINGS: MRI HEAD FINDINGS Brain: There is a large acute left MCA infarct involving much of the left frontal and left temporal lobes, insula, and basal ganglia. There is milder involvement of the left parietal lobe. Small acute infarcts are also present in the left occipital lobe and scattered throughout the right cerebral hemisphere. There is prominent cytotoxic edema associated with the left MCA infarct resulting in regional sulcal effacement, partial effacement of the left lateral ventricle, and 5 mm of rightward midline shift. There is mild left uncal herniation. Scattered petechial hemorrhages are noted within the infarct. There is also minimal blood in the occipital horns of both lateral ventricles. No  ventricular dilatation is seen to suggest acute hydrocephalus. There is no extra-axial fluid collection. Chronic lacunar infarcts are noted in the left thalamus and left caudate. There is mild cerebral atrophy. Vascular: Major intracranial vascular flow voids are preserved. Skull and upper cervical spine: Unremarkable bone marrow signal. Sinuses/Orbits: Bilateral cataract extraction. Mild ethmoid and sphenoid sinus mucosal thickening. Clear mastoid air cells. Other: None. MRA HEAD FINDINGS The visualized distal vertebral arteries are widely patent to the basilar with the left being mildly dominant. Patent PICA and SCA origins are visualized bilaterally. The basilar artery is widely patent. There are large posterior communicating arteries bilaterally. The PCAs are patent without evidence of significant stenosis. The internal carotid arteries are patent from skull base to carotid termini with mild-to-moderate cavernous and proximal supraclinoid atherosclerotic narrowing bilaterally. MCAs are patent without evidence of significant M1 stenosis. There is asymmetric attenuation of right MCA superior division distal branch vessels. The ACAs are patent with suggestion of mild proximal right A1 and mild distal left A1 stenosis. An azygos ACA configuration is again noted. IMPRESSION: 1. Large acute left MCA infarct with extensive edema, 5 mm of rightward midline shift, and left uncal herniation. Associated petechial hemorrhage without malignant hemorrhagic transformation. 2. Minimal intraventricular hemorrhage. 3. Additional small acute infarcts scattered throughout both cerebral hemispheres. 4. Chronic left basal ganglia and left thalamic lacunar infarcts. 5. Persistent patency of the intracranial left ICA and left MCA following recent revascularization. 6. Mild-to-moderate bilateral ICA stenoses. Critical Value/emergent results were called by telephone at the time of interpretation on 10/31/2017 at 2:04 pm to PA [facility], who verbally acknowledged these results. Electronically Signed   By: Sebastian Ache M.D.   On: 10/31/2017 14:08     STUDIES:  MRI as above  CULTURES: none  ANTIBIOTICS: None  LINES/TUBES: ETT Art line  DISCUSSION: Neurol had a discussion with family as to prognosis despite hypertonic saline. Reportedly, family has decided to stop aggressive therapy. Palliative care consulted with plans for one-way extubation  ASSESSMENT / PLAN:  PULMONARY A: Consider new management plans, no further aggressive weaning P:   AS above  CARDIOVASCULAR A:  A-fib but hemodyn stable P:  Status  RENAL A:   AKI; renal indices normalized P:   Trend  NEUROLOGIC A:   As discussed above P:   Plan for one-way extubation   Critical care time: 30 min  Pulmonary and Critical Care Medicine [facility] Pager: [phone]  11/01/2017, 10:46 AM

## 2017-11-01 NOTE — Progress Notes (Signed)
[facility]  Subjective  - sedated on vent   Objective 122/75 86 97.6 F (36.4 C) (Axillary) 17 95%  Intake/Output Summary (Last 24 hours) at 11/01/2017 [id number] Last data filed at 11/01/2017 0600 Gross per 24 hour  Intake 3139.21 ml  Output 2115 ml  Net 1024.21 ml   Right foot warm incision right groin clean  Assessment/Planning: S/p right femoral endarterectomy and fasciotomy Pt neuro status still poor, significant brain injury on MRI yesterday VAC malfunction last night.  Will try to fix this morning otherwise wet to dry Will consider closure of fasciotomy at bedside tomorrow depending on overall neuro condition and prognosis.  Will update Dr Randie Heinzain who will see pt tomorrow.  Fabienne BrunsCharles Adkins 11/01/2017 8:28 AM --  Laboratory Lab Results: Recent Labs    10/31/17 0045 11/01/17 0510  WBC 8.6 6.6  HGB 7.5* 7.2*  HCT 22.3* 22.1*  PLT 165 166   BMET Recent Labs    10/31/17 0045  11/01/17 0311 11/01/17 0510  NA 143   < > 151* 152*  K 3.5  --   --  3.7  CL 114*  --   --  125*  CO2 23  --   --  22  GLUCOSE 139*  --   --  136*  BUN 11  --   --  12  CREATININE 1.04  --   --  0.95  CALCIUM 7.8*  --   --  7.6*   < > = values in this interval not displayed.    COAG Lab Results  Component Value Date   INR 1.75 11/01/2017   INR 2.07 10/31/2017   INR 2.29 10/30/2017   No results found for: PTT

## 2017-11-02 ENCOUNTER — Encounter (HOSPITAL_COMMUNITY): Payer: Self-pay | Admitting: Interventional Radiology

## 2017-11-02 DIAGNOSIS — G935 Compression of brain: Secondary | ICD-10-CM

## 2017-11-02 DIAGNOSIS — G936 Cerebral edema: Secondary | ICD-10-CM

## 2017-11-02 DIAGNOSIS — I482 Chronic atrial fibrillation, unspecified: Secondary | ICD-10-CM

## 2017-11-02 DIAGNOSIS — I63232 Cerebral infarction due to unspecified occlusion or stenosis of left carotid arteries: Secondary | ICD-10-CM

## 2017-11-02 LAB — GLUCOSE, CAPILLARY
GLUCOSE-CAPILLARY: 122 mg/dL — AB (ref 70–99)
GLUCOSE-CAPILLARY: 128 mg/dL — AB (ref 70–99)

## 2017-11-02 LAB — SODIUM: Sodium: 160 mmol/L — ABNORMAL HIGH (ref 135–145)

## 2017-11-02 MED ORDER — PROPOFOL 1000 MG/100ML IV EMUL
50.0000 ug/kg/min | INTRAVENOUS | Status: DC
  Administered 2017-11-02: 50 ug/kg/min via INTRAVENOUS
  Filled 2017-11-02 (×2): qty 100

## 2017-11-02 MED ORDER — GLYCOPYRROLATE 0.2 MG/ML IJ SOLN: 0.2000 mg | INTRAMUSCULAR | Status: DC | PRN

## 2017-11-02 MED ORDER — MORPHINE BOLUS VIA INFUSION
5.0000 mg | INTRAVENOUS | Status: DC | PRN
  Filled 2017-11-02: qty 20

## 2017-11-02 MED ORDER — LORAZEPAM BOLUS VIA INFUSION
2.0000 mg | INTRAVENOUS | Status: DC | PRN
  Filled 2017-11-02: qty 5

## 2017-11-02 MED ORDER — ORAL CARE MOUTH RINSE: 15.0000 mL | OROMUCOSAL | Status: DC | PRN

## 2017-11-02 MED ORDER — MORPHINE 100MG IN NS 100ML (1MG/ML) PREMIX INFUSION
10.0000 mg/h | INTRAVENOUS | Status: DC
  Administered 2017-11-02: 10 mg/h via INTRAVENOUS
  Filled 2017-11-02 (×2): qty 100

## 2017-11-02 MED ORDER — LORAZEPAM 2 MG/ML IJ SOLN
5.0000 mg/h | INTRAMUSCULAR | Status: DC
  Administered 2017-11-02: 5 mg/h via INTRAVENOUS
  Filled 2017-11-02: qty 25

## 2017-11-03 ENCOUNTER — Encounter (HOSPITAL_COMMUNITY): Payer: Self-pay

## 2017-11-04 ENCOUNTER — Encounter (HOSPITAL_COMMUNITY): Payer: Self-pay | Admitting: Vascular Surgery

## 2017-11-29 NOTE — Progress Notes (Signed)
Pt extubated per withdrawal order.  RNs at bedside, family outside pt room.

## 2017-11-29 NOTE — Progress Notes (Signed)
Patient passed peacefully at 1221 with family, RN and Palliative Care NP at bedside.     Wasted 19 ml of morphine and 19 ml of ativan in the sink. Witnessed by Almon RegisterSam Peickert, RN.

## 2017-11-29 NOTE — Death Summary Note (Signed)
Stroke Discharge Summary  Patient ID: Hunter Adkins   MRN: [id number]      DOB: 1941-02-11  Date of Admission: 11-27-17 Date of Discharge: 11/13/2017  Attending Physician:  Marvel Plan, MD, Stroke MD Consultant(s):   Treatment Team:  Pccm, Md, MD Maeola Harman, MD interventional neuroradiology  Patient's PCP:  Jerl Mina, MD  DISCHARGE DIAGNOSIS:  Active Problems:   Large left MCA infarct   B/l MCA small infarcts   Middle cerebral artery embolism, left   Left ICA occlusion   Right femoral artery occlusion s/p enterectomy and vein patch angioplasty   S/p right LE fasciotomies   Afib on coumadin    Acute respiratory failure (HCC)   Endotracheally intubated   Cerebral edema   Brain herniation   dysphagia   Past Medical History:  Diagnosis Date  . Anemia   . Cardiomyopathy (HCC)   . CHF (congestive heart failure) (HCC)   . COPD (chronic obstructive pulmonary disease) (HCC)   . Dysrhythmia    Atrial Fibrillation  . ETOH abuse   . GERD (gastroesophageal reflux disease)   . History of DVT of lower extremity   . History of Helicobacter pylori infection    Past Surgical History:  Procedure Laterality Date  . CARDIAC CATHETERIZATION    . COLONOSCOPY W/ POLYPECTOMY    . COLONOSCOPY WITH PROPOFOL N/A 10/14/2017   Procedure: COLONOSCOPY WITH PROPOFOL;  Surgeon: Scot Jun, MD;  Location: [facility] ENDOSCOPY;  Service: Endoscopy;  Laterality: N/A;  . ESOPHAGOGASTRODUODENOSCOPY (EGD) WITH PROPOFOL N/A 10/14/2017   Procedure: ESOPHAGOGASTRODUODENOSCOPY (EGD) WITH PROPOFOL;  Surgeon: Scot Jun, MD;  Location: [facility] ENDOSCOPY;  Service: Endoscopy;  Laterality: N/A;  . IR ANGIO INTRA EXTRACRAN SEL COM CAROTID INNOMINATE BILAT MOD SED  November 27, 2017  . IR ANGIOGRAM EXTREMITY BILATERAL  2017-11-27  . IR CT HEAD LTD  11-27-2017  . RADIOLOGY WITH ANESTHESIA N/A November 27, 2017   Procedure: IR WITH ANESTHESIA;  Surgeon: Julieanne Cotton, MD;  Location: [facility] OR;  Service:  Radiology;  Laterality: N/A;      LABORATORY STUDIES CBC    Component Value Date/Time   WBC 6.6 11/01/2017 0510   RBC 2.33 (L) 11/01/2017 0510   HGB 7.2 (L) 11/01/2017 0510   HGB 11.0 (L) 02/01/2012 0354   HCT 22.1 (L) 11/01/2017 0510   HCT 36.1 (L) 01/27/2012 0329   PLT 166 11/01/2017 0510   PLT 186 02/01/2012 0354   MCV 94.8 11/01/2017 0510   MCV 96 01/27/2012 0329   MCH 30.9 11/01/2017 0510   MCHC 32.6 11/01/2017 0510   RDW 14.1 11/01/2017 0510   RDW 13.8 01/27/2012 0329   LYMPHSABS 1.4 10/30/2017 0620   LYMPHSABS 2.1 01/27/2012 0329   MONOABS 1.1 (H) 10/30/2017 0620   MONOABS 0.5 01/27/2012 0329   EOSABS 0.0 10/30/2017 0620   EOSABS 0.0 01/27/2012 0329   BASOSABS 0.0 10/30/2017 0620   BASOSABS 0.0 01/27/2012 0329   CMP    Component Value Date/Time   NA 160 (H) 11/14/2017 0308   NA 143 01/27/2012 0329   K 3.7 11/01/2017 0510   K 4.5 01/27/2012 0329   CL 125 (H) 11/01/2017 0510   CL 109 (H) 01/27/2012 0329   CO2 22 11/01/2017 0510   CO2 25 01/27/2012 0329   GLUCOSE 136 (H) 11/01/2017 0510   GLUCOSE 105 (H) 01/27/2012 0329   BUN 12 11/01/2017 0510   BUN 12 01/27/2012 0329   CREATININE 0.95 11/01/2017 0510   CREATININE 1.20  01/27/2012 0329   CALCIUM 7.6 (L) 11/01/2017 0510   CALCIUM 7.8 (L) 01/27/2012 0329   PROT 5.0 (L) 10/31/2017 0045   PROT 7.6 01/26/2012 1925   ALBUMIN 2.6 (L) 10/31/2017 0045   ALBUMIN 3.7 01/26/2012 1925   AST 17 10/31/2017 0045   AST 22 01/26/2012 1925   ALT 9 10/31/2017 0045   ALT 17 01/26/2012 1925   ALKPHOS 44 10/31/2017 0045   ALKPHOS 92 01/26/2012 1925   BILITOT 0.7 10/31/2017 0045   BILITOT 0.9 01/26/2012 1925   GFRNONAA >60 11/01/2017 0510   GFRNONAA >60 01/27/2012 0329   GFRAA >60 11/01/2017 0510   GFRAA >60 01/27/2012 0329   COAGS Lab Results  Component Value Date   INR 1.75 11/01/2017   INR 2.07 10/31/2017   INR 2.29 10/30/2017   Lipid Panel    Component Value Date/Time   CHOL 117 11/04/2017 0500   CHOL  126 01/27/2012 0329   TRIG 168 (H) 11/01/2017 0510   TRIG 64 01/27/2012 0329   HDL 37 (L) 11/28/2017 0500   HDL 43 01/27/2012 0329   CHOLHDL 3.2 11/07/2017 0500   VLDL 13 11/21/2017 0500   VLDL 13 01/27/2012 0329   LDLCALC 67 11/26/2017 0500   LDLCALC 70 01/27/2012 0329   HgbA1C  Lab Results  Component Value Date   HGBA1C 5.8 (H) 11/05/2017   Urinalysis No results found for: COLORURINE, APPEARANCEUR, LABSPEC, PHURINE, GLUCOSEU, HGBUR, BILIRUBINUR, KETONESUR, PROTEINUR, UROBILINOGEN, NITRITE, LEUKOCYTESUR Urine Drug Screen     Component Value Date/Time   LABOPIA NONE DETECTED 11/11/2017 0730   COCAINSCRNUR NONE DETECTED 11/22/2017 0730   LABBENZ NONE DETECTED 11/01/2017 0730   AMPHETMU NONE DETECTED 11/06/2017 0730   THCU NONE DETECTED 11/22/2017 0730   LABBARB NONE DETECTED 11/27/2017 0730    Alcohol Level    Component Value Date/Time   ETH <10 10/28/2017 2315     SIGNIFICANT DIAGNOSTIC STUDIES Ct Angio Head W Or Wo Contrast  Result Date: 11/16/2017 CLINICAL DATA:  Initial evaluation for EXAM: CT ANGIOGRAPHY HEAD AND NECK CT PERFUSION BRAIN TECHNIQUE: Multidetector CT imaging of the head and neck was performed using the standard protocol during bolus administration of intravenous contrast. Multiplanar CT image reconstructions and MIPs were obtained to evaluate the vascular anatomy. Carotid stenosis measurements (when applicable) are obtained utilizing NASCET criteria, using the distal internal carotid diameter as the denominator. Multiphase CT imaging of the brain was performed following IV bolus contrast injection. Subsequent parametric perfusion maps were calculated using RAPID software. CONTRAST:  OMNIPAQUE IOHEXOL 350 MG/ML SOLN COMPARISON:  Prior head CT from earlier the same day. FINDINGS: CTA NECK FINDINGS Aortic arch: Visualized aortic arch of normal caliber with normal 3 vessel morphology. Atheromatous plaque about the use arch and origin of the great vessels  without hemodynamically significant stenosis. Visualized subclavian arteries widely patent Right carotid system: Scattered atheromatous plaque within the right common carotid artery without stenosis. Atherosclerotic change about the right bifurcation/proximal right ICA without significant stenosis. Right ICA widely patent distally to the skull base without stenosis, dissection, or occlusion. Left carotid system: Left common carotid artery patent from its origin to the bifurcation without significant stenosis. Abrupt occlusion of the left ICA just distal to the bifurcation. Left ICA remains occluded within the neck. Vertebral arteries: Both of the vertebral arteries arise from the subclavian arteries. Left vertebral artery dominant. Vertebral arteries patent within the neck without stenosis, dissection, or occlusion. Skeleton: No acute osseus abnormality. No discrete lytic or blastic osseous lesions. Moderate  cervical spondylolysis at C4-5 through C6-7. Other neck: No other acute abnormality within the neck. Upper chest: Visualized upper chest demonstrates no acute finding. Severe emphysema. Review of the MIP images confirms the above findings CTA HEAD FINDINGS Anterior circulation: Left ICA remains occluded to the terminus. Left M1 segment and proximal left MCA branches occluded. Scant collateral flow seen distally. Multifocal atheromatous plaque throughout the cavernous/supraclinoid right ICA with moderate multifocal stenosis right M1 widely patent. No proximal right M2 occlusion distal right MCA branches demonstrate atheromatous irregularity but are perfused to their distal aspects. Right A1 patent. Retrograde opacification of the left A1 which appears slightly hypoplastic. Azygos ACA widely patent to its distal aspects. Posterior circulation: Vertebral arteries patent to the vertebrobasilar junction without stenosis. Left vertebral artery dominant posterior inferior cerebral arteries patent bilaterally. Basilar  patent to its distal aspect without stenosis. Superior cerebral arteries patent bilaterally. Both of the PCAs patent to their distal aspects. Prominent posterior communicating arteries noted bilaterally. Venous sinuses: Patent. Anatomic variants: None significant. Delayed phase: 5 mm focus of parenchymal enhancement along the gray-white matter differentiation of the anterior right frontal lobe noted (series 13, image 20), indeterminate, but favored to be vascular in nature. No surrounding edema or other abnormality. No other abnormal enhancement. Review of the MIP images confirms the above findings CT Brain Perfusion Findings: CBF (<30%) Volume: 40mL Perfusion (Tmax>6.0s) volume: Mismatch Volume: Infarction Location:Evidence for acute ischemic infarct involving the left MCA distribution, with involvement of the left insular region and subcortical and deep white matter of the left cerebral hemisphere. Surrounding ischemic penumbra involves much of the left cerebral hemisphere, with additional scattered perfusion abnormality within the right cerebral hemisphere. Findings are felt to be at least partially artifactual in nature. IMPRESSION: 1. Acute EVLO with occlusion of the left ICA just distal to the bifurcation. Left ICA remains occluded to the terminus with absent flow within the left MCA artery. Fairly mild collateralization seen distally within the left MCA distribution. 2. Evidence for core infarct involving the left cerebral hemisphere as above. Surrounding ischemic penumbra with perfusion mismatch volume of 483 mL, suspected to at least in part be artifactual in nature on this examination. However, a fairly substantial surrounding penumbra is still somewhat suspected. 3. Patent azygos ACA. 4. Moderate atheromatous plaque throughout the right carotid siphon with moderate multifocal stenosis. No other high-grade or hemodynamically significant stenosis identified. 5. Emphysema. Critical Value/emergent  results were called by telephone at the time of interpretation on 11/11/2017 at 12:35 am to Dr. Reesa Chew , who verbally acknowledged these results. Electronically Signed   By: Rise Mu M.D.   On: 11/17/2017 00:56   Ct Head Wo Contrast  Result Date: 11/01/2017 CLINICAL DATA:  77 y/o  M; 77 y/o  M; stroke for follow-up. EXAM: CT HEAD WITHOUT CONTRAST TECHNIQUE: Contiguous axial images were obtained from the base of the skull through the vertex without intravenous contrast. COMPARISON:  10/31/2017 MRI head. 11/15/2017 CTA head. 10/28/2017 CT head. FINDINGS: Brain: Left MCA large infarction is stable in distribution in comparison with prior MRI of the head given differences in technique involving frontal and temporal lobes, insula, and basal ganglia. Subcentimeter foci of petechial hemorrhage within the hemorrhage are also stable. There is extensive edema with 6 mm of left-to-right midline shift, uncal herniation, and effacement of the left lateral ventricle which is stable to minimally increased from the prior MRI. No new large acute stroke, hemorrhage, or mass effect is identified. Vascular: Calcific atherosclerosis of carotid  siphons. Skull: Normal. Negative for fracture or focal lesion. Sinuses/Orbits: Right ethmoid and sphenoid sinus mucosal thickening. Chronic inflammatory changes of the walls of right sphenoid sinus. Other: Fall patient is intubated with nasoenteric tube, partially visualized. IMPRESSION: 1. Left MCA infarct is stable in distribution from prior MRI of the head given differences in technique. 2. Stable petechial hemorrhage within the infarction. No new acute intracranial hemorrhage. 3. Stable to minimally increased mass effect with 6 mm left-to-right midline shift, uncal herniation, and partial effacement of left lateral ventricle. Electronically Signed   By: Mitzi Hansen M.D.   On: 11/01/2017 02:09   Ct Angio Neck W Or Wo Contrast  Result Date: 11/26/2017 CLINICAL  DATA:  Initial evaluation for EXAM: CT ANGIOGRAPHY HEAD AND NECK CT PERFUSION BRAIN TECHNIQUE: Multidetector CT imaging of the head and neck was performed using the standard protocol during bolus administration of intravenous contrast. Multiplanar CT image reconstructions and MIPs were obtained to evaluate the vascular anatomy. Carotid stenosis measurements (when applicable) are obtained utilizing NASCET criteria, using the distal internal carotid diameter as the denominator. Multiphase CT imaging of the brain was performed following IV bolus contrast injection. Subsequent parametric perfusion maps were calculated using RAPID software. CONTRAST:  OMNIPAQUE IOHEXOL 350 MG/ML SOLN COMPARISON:  Prior head CT from earlier the same day. FINDINGS: CTA NECK FINDINGS Aortic arch: Visualized aortic arch of normal caliber with normal 3 vessel morphology. Atheromatous plaque about the use arch and origin of the great vessels without hemodynamically significant stenosis. Visualized subclavian arteries widely patent Right carotid system: Scattered atheromatous plaque within the right common carotid artery without stenosis. Atherosclerotic change about the right bifurcation/proximal right ICA without significant stenosis. Right ICA widely patent distally to the skull base without stenosis, dissection, or occlusion. Left carotid system: Left common carotid artery patent from its origin to the bifurcation without significant stenosis. Abrupt occlusion of the left ICA just distal to the bifurcation. Left ICA remains occluded within the neck. Vertebral arteries: Both of the vertebral arteries arise from the subclavian arteries. Left vertebral artery dominant. Vertebral arteries patent within the neck without stenosis, dissection, or occlusion. Skeleton: No acute osseus abnormality. No discrete lytic or blastic osseous lesions. Moderate cervical spondylolysis at C4-5 through C6-7. Other neck: No other acute abnormality within  the neck. Upper chest: Visualized upper chest demonstrates no acute finding. Severe emphysema. Review of the MIP images confirms the above findings CTA HEAD FINDINGS Anterior circulation: Left ICA remains occluded to the terminus. Left M1 segment and proximal left MCA branches occluded. Scant collateral flow seen distally. Multifocal atheromatous plaque throughout the cavernous/supraclinoid right ICA with moderate multifocal stenosis right M1 widely patent. No proximal right M2 occlusion distal right MCA branches demonstrate atheromatous irregularity but are perfused to their distal aspects. Right A1 patent. Retrograde opacification of the left A1 which appears slightly hypoplastic. Azygos ACA widely patent to its distal aspects. Posterior circulation: Vertebral arteries patent to the vertebrobasilar junction without stenosis. Left vertebral artery dominant posterior inferior cerebral arteries patent bilaterally. Basilar patent to its distal aspect without stenosis. Superior cerebral arteries patent bilaterally. Both of the PCAs patent to their distal aspects. Prominent posterior communicating arteries noted bilaterally. Venous sinuses: Patent. Anatomic variants: None significant. Delayed phase: 5 mm focus of parenchymal enhancement along the gray-white matter differentiation of the anterior right frontal lobe noted (series 13, image 20), indeterminate, but favored to be vascular in nature. No surrounding edema or other abnormality. No other abnormal enhancement. Review of the MIP images confirms  the above findings CT Brain Perfusion Findings: CBF (<30%) Volume: 40mL Perfusion (Tmax>6.0s) volume: Mismatch Volume: Infarction Location:Evidence for acute ischemic infarct involving the left MCA distribution, with involvement of the left insular region and subcortical and deep white matter of the left cerebral hemisphere. Surrounding ischemic penumbra involves much of the left cerebral hemisphere, with  additional scattered perfusion abnormality within the right cerebral hemisphere. Findings are felt to be at least partially artifactual in nature. IMPRESSION: 1. Acute EVLO with occlusion of the left ICA just distal to the bifurcation. Left ICA remains occluded to the terminus with absent flow within the left MCA artery. Fairly mild collateralization seen distally within the left MCA distribution. 2. Evidence for core infarct involving the left cerebral hemisphere as above. Surrounding ischemic penumbra with perfusion mismatch volume of 483 mL, suspected to at least in part be artifactual in nature on this examination. However, a fairly substantial surrounding penumbra is still somewhat suspected. 3. Patent azygos ACA. 4. Moderate atheromatous plaque throughout the right carotid siphon with moderate multifocal stenosis. No other high-grade or hemodynamically significant stenosis identified. 5. Emphysema. Critical Value/emergent results were called by telephone at the time of interpretation on 11/17/2017 at 12:35 am to Dr. Reesa Chew , who verbally acknowledged these results. Electronically Signed   By: Rise Mu M.D.   On: 11/01/2017 00:56   Mr Maxine Glenn Head Wo Contrast  Result Date: 10/31/2017 CLINICAL DATA:  Stroke follow-up. Left ICA and left MCA occlusion status post endovascular revascularization. EXAM: MRI HEAD WITHOUT CONTRAST MRA HEAD WITHOUT CONTRAST TECHNIQUE: Multiplanar, multiecho pulse sequences of the brain and surrounding structures were obtained without intravenous contrast. Angiographic images of the head were obtained using MRA technique without contrast. COMPARISON:  Head CT 10/28/2017, CTA 11/17/2017, and cerebral angiogram 11/17/2017 FINDINGS: MRI HEAD FINDINGS Brain: There is a large acute left MCA infarct involving much of the left frontal and left temporal lobes, insula, and basal ganglia. There is milder involvement of the left parietal lobe. Small acute infarcts are also present  in the left occipital lobe and scattered throughout the right cerebral hemisphere. There is prominent cytotoxic edema associated with the left MCA infarct resulting in regional sulcal effacement, partial effacement of the left lateral ventricle, and 5 mm of rightward midline shift. There is mild left uncal herniation. Scattered petechial hemorrhages are noted within the infarct. There is also minimal blood in the occipital horns of both lateral ventricles. No ventricular dilatation is seen to suggest acute hydrocephalus. There is no extra-axial fluid collection. Chronic lacunar infarcts are noted in the left thalamus and left caudate. There is mild cerebral atrophy. Vascular: Major intracranial vascular flow voids are preserved. Skull and upper cervical spine: Unremarkable bone marrow signal. Sinuses/Orbits: Bilateral cataract extraction. Mild ethmoid and sphenoid sinus mucosal thickening. Clear mastoid air cells. Other: None. MRA HEAD FINDINGS The visualized distal vertebral arteries are widely patent to the basilar with the left being mildly dominant. Patent PICA and SCA origins are visualized bilaterally. The basilar artery is widely patent. There are large posterior communicating arteries bilaterally. The PCAs are patent without evidence of significant stenosis. The internal carotid arteries are patent from skull base to carotid termini with mild-to-moderate cavernous and proximal supraclinoid atherosclerotic narrowing bilaterally. MCAs are patent without evidence of significant M1 stenosis. There is asymmetric attenuation of right MCA superior division distal branch vessels. The ACAs are patent with suggestion of mild proximal right A1 and mild distal left A1 stenosis. An azygos ACA configuration is again noted.  IMPRESSION: 1. Large acute left MCA infarct with extensive edema, 5 mm of rightward midline shift, and left uncal herniation. Associated petechial hemorrhage without malignant hemorrhagic  transformation. 2. Minimal intraventricular hemorrhage. 3. Additional small acute infarcts scattered throughout both cerebral hemispheres. 4. Chronic left basal ganglia and left thalamic lacunar infarcts. 5. Persistent patency of the intracranial left ICA and left MCA following recent revascularization. 6. Mild-to-moderate bilateral ICA stenoses. Critical Value/emergent results were called by telephone at the time of interpretation on 10/31/2017 at 2:04 pm to PA [facility], who verbally acknowledged these results. Electronically Signed   By: Sebastian Ache M.D.   On: 10/31/2017 14:08   Mr Brain Wo Contrast  Result Date: 10/31/2017 CLINICAL DATA:  Stroke follow-up. Left ICA and left MCA occlusion status post endovascular revascularization. EXAM: MRI HEAD WITHOUT CONTRAST MRA HEAD WITHOUT CONTRAST TECHNIQUE: Multiplanar, multiecho pulse sequences of the brain and surrounding structures were obtained without intravenous contrast. Angiographic images of the head were obtained using MRA technique without contrast. COMPARISON:  Head CT 10/28/2017, CTA 11/24/2017, and cerebral angiogram 11/14/2017 FINDINGS: MRI HEAD FINDINGS Brain: There is a large acute left MCA infarct involving much of the left frontal and left temporal lobes, insula, and basal ganglia. There is milder involvement of the left parietal lobe. Small acute infarcts are also present in the left occipital lobe and scattered throughout the right cerebral hemisphere. There is prominent cytotoxic edema associated with the left MCA infarct resulting in regional sulcal effacement, partial effacement of the left lateral ventricle, and 5 mm of rightward midline shift. There is mild left uncal herniation. Scattered petechial hemorrhages are noted within the infarct. There is also minimal blood in the occipital horns of both lateral ventricles. No ventricular dilatation is seen to suggest acute hydrocephalus. There is no extra-axial fluid collection. Chronic lacunar  infarcts are noted in the left thalamus and left caudate. There is mild cerebral atrophy. Vascular: Major intracranial vascular flow voids are preserved. Skull and upper cervical spine: Unremarkable bone marrow signal. Sinuses/Orbits: Bilateral cataract extraction. Mild ethmoid and sphenoid sinus mucosal thickening. Clear mastoid air cells. Other: None. MRA HEAD FINDINGS The visualized distal vertebral arteries are widely patent to the basilar with the left being mildly dominant. Patent PICA and SCA origins are visualized bilaterally. The basilar artery is widely patent. There are large posterior communicating arteries bilaterally. The PCAs are patent without evidence of significant stenosis. The internal carotid arteries are patent from skull base to carotid termini with mild-to-moderate cavernous and proximal supraclinoid atherosclerotic narrowing bilaterally. MCAs are patent without evidence of significant M1 stenosis. There is asymmetric attenuation of right MCA superior division distal branch vessels. The ACAs are patent with suggestion of mild proximal right A1 and mild distal left A1 stenosis. An azygos ACA configuration is again noted. IMPRESSION: 1. Large acute left MCA infarct with extensive edema, 5 mm of rightward midline shift, and left uncal herniation. Associated petechial hemorrhage without malignant hemorrhagic transformation. 2. Minimal intraventricular hemorrhage. 3. Additional small acute infarcts scattered throughout both cerebral hemispheres. 4. Chronic left basal ganglia and left thalamic lacunar infarcts. 5. Persistent patency of the intracranial left ICA and left MCA following recent revascularization. 6. Mild-to-moderate bilateral ICA stenoses. Critical Value/emergent results were called by telephone at the time of interpretation on 10/31/2017 at 2:04 pm to PA [facility], who verbally acknowledged these results. Electronically Signed   By: Sebastian Ache M.D.   On: 10/31/2017 14:08   Ir  Angiogram Extremity Bilateral  Result Date: 11/03/2017 INDICATION:  Sudden onset of right-sided hemiplegia, left gaze deviation, and global aphasia. Occluded left internal carotid artery, the left middle cerebral artery and the left anterior cerebral artery on CT angiogram. EXAM: 1. EMERGENT LARGE VESSEL OCCLUSION THROMBOLYSIS (anterior CIRCULATION) COMPARISON:  CT angiogram of the head and neck of 10-31-17. MEDICATIONS: Ancef 2 g IV antibiotic was administered within 1 hour of the procedure. ANESTHESIA/SEDATION: General anesthesia. CONTRAST:  Isovue 300 approximately 140 mL FLUOROSCOPY TIME:  Fluoroscopy Time: 73 minutes 12 seconds (3673 mGy). COMPLICATIONS: None im none immediate. TECHNIQUE: Following a full explanation of the procedure along with the potential associated complications, an informed witnessed consent was obtained from the patient's daughter-in-law and his spouse. The risks of intracranial hemorrhage of 10%, worsening neurological deficit, ventilator dependency, death and inability to revascularize were all reviewed in detail with the patient's spouse and daughter-in-law. The patient was then put under general anesthesia by the Department of Anesthesiology at [facility]. The left groin was prepped and draped in the usual sterile fashion. Thereafter using modified Seldinger technique, transfemoral access into the right common femoral artery was obtained without difficulty. Over a 0.035 inch guidewire a 5 French Pinnacle sheath was inserted. Through this, and also over a 0.035 inch guidewire a 5 [place] JB 1 catheter was advanced to the aortic arch region and selectively positioned in the left common carotid artery. FINDINGS: The left common carotid arteriogram demonstrates the left external carotid artery and its major branches to be widely patent. The left internal carotid artery at the bulb appears patent with slow ascent of contrast to the cranial skull base. 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 Normal distal reconstitution is noted from the external carotid artery branches. PROCEDURE: ENDOVASCULAR REVASCULARIZATION OF OCCLUDED LEFT INTERNAL CAROTID ARTERY TERMINUS T OCCLUSION, THE LEFT MIDDLE CEREBRAL ARTERY AND THE LEFT ANTERIOR CEREBRAL ARTERY WITH 3 PASSES WITH THE EMBOTRAP 5 MM X 33 MM RETRIEVAL DEVICE, AND 1 PASS WITH THE 3 MM X 20 MM TREVO PROVUE RETRIEVAL DEVICE ACHIEVING A TICI 2B REVASCULARIZATION. The diagnostic JB 1 catheter in the left common carotid artery was exchanged over a 0.035 inch 300 cm Rosen exchange guidewire for an 8 French 55 cm Brite tip neurovascular sheath using biplane roadmap technique and constant fluoroscopic guidance. A good aspiration was obtained from the hub of the neurovascular sheath. This was then connected to continuous heparinized saline infusion. Over the [employer] guidewire, an 8 [place] 85 cm FlowGate balloon guide catheter which had been prepped with 50% contrast and 50% heparinized saline infusion was positioned just proximal to the left common carotid bifurcation. The guidewire was removed. Good aspiration obtained from the hub of the [facility] guide catheter. Using biplane roadmap technique and constant fluoroscopic guidance, over a 0.035 inch Roadrunner guidewire, an 8 [place] FlowGate guide catheter was advanced to the mid cervical left ICA. Again good aspiration obtained from the hub of this guide catheter with no evidence of dissections or occlusions at the site of the tip. However, there continued be completely occluded left internal carotid artery as described above. Over a 0.014 inch Softip Synchro micro guidewire, a combination of an 021 Trevo ProVue microcatheter inside a 5 French 115 cm Navien guide catheter was advanced to the distal end of the [facility] guide catheter. With the micro guidewire leading with a J-tip configuration, the combination was navigated to the proximal cavernous segment.  Thereafter using a torque device, the micro guidewire was gently manipulated through the occluded left  internal carotid artery cavernous segment, the supraclinoid segment into the middle cerebral artery inferior division M2 M3 region. The guidewire was removed. Good aspiration obtained from the hub of the microcatheter. A gentle contrast injection demonstrated stasis which cleared with slight retrieval of the tip of the microcatheter. At this time, a 5 mm x 33 mm Embotrap retrieval device was advanced to the distal end of the microcatheter using biplane roadmap technique and constant fluoroscopic guidance in a coaxial manner and with constant heparinized saline infusion. The proximal and the distal markers were then positioned as planned. A gentle contrast injection through the Navien guide catheter demonstrated no revascularization. Proximal flow arrest was then initiated by inflating the balloon in the left internal carotid artery of the [facility] guide catheter. With constant aspiration being applied with a 60 mL syringe at the hub of the [facility] guide catheter, and with a 25 mL syringe at the hub of the Navien guide catheter, the combination of the retrieval device, the microcatheter and Navien guide catheter was gently retrieved and removed. Aspiration was continued as the balloon was then deflated. The aspirate contained small chunks of dark clot in the interstices of the retrieval device. A few specks were also noted in the aspirate. Free back bleed of blood was noted at the hub of the [facility] guide catheter. A gentle contrast injection through the [facility] guide catheter in the left internal carotid artery demonstrated only minimal improvement of the ascent of contrast with now the ophthalmic artery and the posterior communicating arteries opacified though with large chunks of clot in the distal cavernous and the supraclinoid segments. A second pass was then made with a combination as described above. Again  after having deployed the retrieval device such that the proximal portion was at the proximal portion of the clot, and with proximal flow arrest by inflating the balloon in the left internal carotid artery of the [facility] guide catheter, the combination of the retrieval device, the Navien guide catheter, and the microcatheter were retrieved and removed as constant aspiration was applied with a 60 mL syringe at the hub of the [facility] guide catheter and the Navien guide catheter. Aspiration was continued as the balloon was deflated in the left internal carotid artery. Free back bleed of blood was noted at the hub of the [facility] guide catheter. A control arteriogram performed following this pass now demonstrated a complete revascularization of the left ICA supraclinoid segment, and the left anterior cerebral artery and the proximal half of the left middle cerebral artery. Contrast was seen to extend into the dominant inferior division with multiple areas of filling defects consistent with clot and middle 1/3 occluded. Also noted was an occluded hypoplastic superior division. The aspirate contained large chunks of clot as well as in the interstices of the retrieval device. A third pass was then made again with the combination this time a 5 [place] 125 cm [place] guide catheter inside of which was an 021 Trevo ProVue microcatheter which were advanced in combination over a 0.014 inch Softip Synchro micro guidewire. The micro guidewire was advanced through a large clot in the proximal inferior division and into the inferior branch of a division of this in the M2 M3 region. Good aspiration of blood was obtained from the hub of the microcatheter. A gentle contrast injection demonstrated antegrade flow into the M3 branch of the inferior division. An Embotrap 5 mm x 33 mm device was again advanced to the distal tip of the microcatheter as described previously.  The proximal and the distal landing zones were then identified.  Thereafter, O ring on the delivery microcatheter was loosened. With slight forward gentle traction with the right hand on the delivery micro guidewire, with the left hand the delivery microcatheter was retrieved unsheathing the distal portion of the device, and the proximal portions. A control arteriogram performed through the Navien guide catheter in the distal left internal carotid artery demonstrated a TICI 2b reperfusion. Again with proximal flow arrest in the proximal left internal carotid artery as described above, and constant aspiration being applied with a 60 mL syringe at the hub of the [facility] guide catheter and with a 25 mL syringe with the hub of the [place] guide catheter. The retrieval device, the microcatheter and the [facility] guide catheter were then gently retrieved and removed as constant aspiration was applied at the hub of the [facility] guide catheter with a 60 mL syringe, and at the hub of the [place] guide catheter with a 25 mL syringe. Following retrieval of the combination, the flow arrest was reversed in the left internal carotid artery. The aspirate also contained more small chunks of clot. Back bleed at the hub of the [facility] guide catheter was established. A gentle control arteriogram performed through the 8 [place] FlowGate guide catheter demonstrated brisk flow into the left internal carotid artery intracranially, the left anterior cerebral artery, the left posterior communicating artery, which improved. There was now complete revascularization of the dominant inferior division and also a branch of the superior division. The origin of the superior division remained occluded with a small stump. At this time, a Trevo ProVue 021 microcatheter inside of a Navien 5 French 115 cm guide catheter was advanced over a 0.014 inch Softip Synchro micro guidewire to the supraclinoid left ICA. With the micro guidewire leading with a J-tip configuration, access was obtained into the middle cerebral artery  proximally. Thereafter using a torque device, access with the micro guidewire was obtained into the M2 region of the hypoplastic superior division followed by the microcatheter. The guidewire was removed. There was resistant aspiration of the tip of the microcatheter. This was gently retrieved until free aspiration of blood was seen at the hub. A 3 mm x 20 mm Trevo ProVue retrieval device was then advanced in a coaxial manner and with constant heparinized saline infusion to the distal end of the microcatheter. Again the O ring on the delivery microcatheter was loosened. With slight gentle traction with the right hand on the delivery micro guidewire, with the left hand the delivery microcatheter was retrieved. A gentle control arteriogram performed through the 5 [place] Navien guide catheter in the left internal carotid artery demonstrated no significant revascularization of the superior division. Proximal flow arrest was then initiated in the left internal carotid artery by inflating the balloon of the [facility] guide catheter. The combination of the retrieval device, the microcatheter and the Navien guide catheter were then gently retrieved as constant aspiration with a 60 mL syringe was applied at the hub of the [facility] guide catheter and with a 25 mL syringe at the hub of the Navien guide catheter. This combination was retrieved and removed as constant aspiration was applied at the hub of the [facility] guide catheter as proximal flow arrest was reversed. Free back bleed of blood was noted at the hub of the [facility] guide catheter. A gentle contrast injection through the [facility] guide catheter demonstrated complete revascularization of the inferior division, the anterior cerebral artery and the posterior communicating artery into  the posterior cerebral artery. There continued to be slow flow into the non dominant superior division. At least 3 aliquots of intra-arterial nitroglycerin were then given through the  guide catheter in the left internal carotid artery. There was marginal improvement of the proximal half of the superior division of the middle cerebral artery. A final control arteriogram performed through the 8 [place] FlowGate guide catheter in the left internal carotid artery demonstrated a TICI 2b revascularization of the left MCA distribution. There continued to be slow flow in the superior division of the left middle cerebral artery. Throughout the procedure, the patient's blood pressure and neurological status remained stable. No evidence of extravasation or contrast blush was identified. The 8 [place] FlowGate guide catheter, and the 8 [place] neurovascular sheath were then retrieved and removed with the insertion of a 7 [place] Pinnacle sheath. This was then connected to continuous heparinized saline infusion. Throughout the procedure, the patient's blood pressure and neurological status remained stable. A Dyna CT of the head performed post procedure demonstrated areas of homogeneous contrast accumulation in the caudate head, the caudate body, the putamen, and the left frontal subcortical white matter. The groin sheath and was connected to continuous heparinized saline infusion. The patient's distal pulses remained unchanged with the right sided dorsalis pedis, and posterior tibial regions being absent. 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 A vascular surgical consultation was obtained. The patient was evaluated and felt to be a probable contact for acute revascularization. The left groin appeared soft without evidence of a hematoma or bleeding. IMPRESSION: Status post endovascular complete revascularization of the occluded left  internal carotid artery distally in the T junction involving the left middle cerebral artery and the left anterior cerebral artery with 3 passes with the Embotrap 5 mm x 33 mm retrieval device, and 1 pass with the Trevo ProVue 3 mm x 20 mm retrieval device achieving a TICI 2b revascularization The patient was then transported to the neuro ICU awaiting vascular surgery consultation. PLAN: Return to the clinic in approximately 2-3 weeks post discharge from the hospital. Electronically Signed   By: Julieanne Cotton M.D.   On: 11/21/2017 10:52   [facility]  Result Date: 24-Nov-2017 INDICATION: Sudden onset of right-sided hemiplegia, left gaze deviation, and global aphasia. Occluded left internal carotid artery, the left middle cerebral artery and the left anterior cerebral artery on CT angiogram. EXAM: 1. EMERGENT LARGE VESSEL OCCLUSION THROMBOLYSIS (anterior CIRCULATION) COMPARISON:  CT angiogram of the head and neck of 11/19/2017. MEDICATIONS: Ancef 2 g IV antibiotic was administered within 1 hour of the procedure. ANESTHESIA/SEDATION: General anesthesia. CONTRAST:  Isovue 300 approximately 140 mL FLUOROSCOPY TIME:  Fluoroscopy Time: 73 minutes 12 seconds (3673 mGy). COMPLICATIONS: None im none immediate. TECHNIQUE: Following a full explanation of the procedure along with the potential associated complications, an informed witnessed consent was obtained from the patient's daughter-in-law and his spouse. The risks of intracranial hemorrhage of 10%, worsening neurological deficit, ventilator dependency, death and inability to revascularize were all reviewed in detail with the patient's spouse and daughter-in-law. The patient was then put under general anesthesia by the Department of Anesthesiology at [facility]. The left groin was prepped and draped in the usual sterile fashion. Thereafter using modified Seldinger technique,  transfemoral access into the right common femoral artery was obtained without  difficulty. Over a 0.035 inch guidewire a 5 French Pinnacle sheath was inserted. Through this, and also over a 0.035 inch guidewire a 5 [place] JB 1 catheter was advanced to the aortic arch region and selectively positioned in the left common carotid artery. FINDINGS: The left common carotid arteriogram demonstrates the left external carotid artery and its major branches to be widely patent. The left internal carotid artery at the bulb appears patent with slow ascent of contrast to the cranial skull base. Complete occlusion is noted at the level of the proximal cavernous segment. Normal distal reconstitution is noted from the external carotid artery branches. PROCEDURE: ENDOVASCULAR REVASCULARIZATION OF OCCLUDED LEFT INTERNAL CAROTID ARTERY TERMINUS T OCCLUSION, THE LEFT MIDDLE CEREBRAL ARTERY AND THE LEFT ANTERIOR CEREBRAL ARTERY WITH 3 PASSES WITH THE EMBOTRAP 5 MM X 33 MM RETRIEVAL DEVICE, AND 1 PASS WITH THE 3 MM X 20 MM TREVO PROVUE RETRIEVAL DEVICE ACHIEVING A TICI 2B REVASCULARIZATION. The diagnostic JB 1 catheter in the left common carotid artery was exchanged over a 0.035 inch 300 cm Rosen exchange guidewire for an 8 French 55 cm Brite tip neurovascular sheath using biplane roadmap technique and constant fluoroscopic guidance. A good aspiration was obtained from the hub of the neurovascular sheath. This was then connected to continuous heparinized saline infusion. Over the [employer] guidewire, an 8 [place] 85 cm FlowGate balloon guide catheter which had been prepped with 50% contrast and 50% heparinized saline infusion was positioned just proximal to the left common carotid bifurcation. The guidewire was removed. Good aspiration obtained from the hub of the [facility] guide catheter. Using biplane roadmap technique and constant fluoroscopic guidance, over a 0.035 inch Roadrunner guidewire, an 8 [place] FlowGate guide catheter was advanced to the mid cervical left ICA. Again good aspiration obtained from the  hub of this guide catheter with no evidence of dissections or occlusions at the site of the tip. However, there continued be completely occluded left internal carotid artery as described above. Over a 0.014 inch Softip Synchro micro guidewire, a combination of an 021 Trevo ProVue microcatheter inside a 5 French 115 cm Navien guide catheter was advanced to the distal end of the [facility] guide catheter. With the micro guidewire leading with a J-tip configuration, the combination was navigated to the proximal cavernous segment. Thereafter using a torque device, the micro guidewire was gently manipulated through the occluded left internal carotid artery cavernous segment, the supraclinoid segment into the middle cerebral artery inferior division M2 M3 region. The guidewire was removed. Good aspiration obtained from the hub of the microcatheter. A gentle contrast injection demonstrated stasis which cleared with slight retrieval of the tip of the microcatheter. At this time, a 5 mm x 33 mm Embotrap retrieval device was advanced to the distal end of the microcatheter using biplane roadmap technique and constant fluoroscopic guidance in a coaxial manner and with constant heparinized saline infusion. The proximal and the distal markers were then positioned as planned. A gentle contrast injection through the Navien guide catheter demonstrated no revascularization. Proximal flow arrest was then initiated by inflating the balloon in the left internal carotid artery of the [facility] guide catheter. With constant aspiration being applied with a 60 mL syringe at the hub of the [facility] guide catheter, and with a 25 mL syringe at the hub of the Navien guide catheter, the combination of the retrieval device, the microcatheter and Navien guide catheter was gently retrieved and  removed. Aspiration was continued as the balloon was then deflated. The aspirate contained small chunks of dark clot in the interstices of the retrieval device.  A few specks were also noted in the aspirate. Free back bleed of blood was noted at the hub of the [facility] guide catheter. A gentle contrast injection through the [facility] guide catheter in the left internal carotid artery demonstrated only minimal improvement of the ascent of contrast with now the ophthalmic artery and the posterior communicating arteries opacified though with large chunks of clot in the distal cavernous and the supraclinoid segments. A second pass was then made with a combination as described above. Again after having deployed the retrieval device such that the proximal portion was at the proximal portion of the clot, and with proximal flow arrest by inflating the balloon in the left internal carotid artery of the [facility] guide catheter, the combination of the retrieval device, the Navien guide catheter, and the microcatheter were retrieved and removed as constant aspiration was applied with a 60 mL syringe at the hub of the [facility] guide catheter and the Navien guide catheter. Aspiration was continued as the balloon was deflated in the left internal carotid artery. Free back bleed of blood was noted at the hub of the [facility] guide catheter. A control arteriogram performed following this pass now demonstrated a complete revascularization of the left ICA supraclinoid segment, and the left anterior cerebral artery and the proximal half of the left middle cerebral artery. Contrast was seen to extend into the dominant inferior division with multiple areas of filling defects consistent with clot and middle 1/3 occluded. Also noted was an occluded hypoplastic superior division. The aspirate contained large chunks of clot as well as in the interstices of the retrieval device. A third pass was then made again with the combination this time a 5 [place] 125 cm [place] guide catheter inside of which was an 021 Trevo ProVue microcatheter which were advanced in combination over a 0.014 inch Softip Synchro  micro guidewire. The micro guidewire was advanced through a large clot in the proximal inferior division and into the inferior branch of a division of this in the M2 M3 region. Good aspiration of blood was obtained from the hub of the microcatheter. A gentle contrast injection demonstrated antegrade flow into the M3 branch of the inferior division. An Embotrap 5 mm x 33 mm device was again advanced to the distal tip of the microcatheter as described previously. The proximal and the distal landing zones were then identified. Thereafter, O ring on the delivery microcatheter was loosened. With slight forward gentle traction with the right hand on the delivery micro guidewire, with the left hand the delivery microcatheter was retrieved unsheathing the distal portion of the device, and the proximal portions. A control arteriogram performed through the Navien guide catheter in the distal left internal carotid artery demonstrated a TICI 2b reperfusion. Again with proximal flow arrest in the proximal left internal carotid artery as described above, and constant aspiration being applied with a 60 mL syringe at the hub of the [facility] guide catheter and with a 25 mL syringe with the hub of the [place] guide catheter. The retrieval device, the microcatheter and the [facility] guide catheter were then gently retrieved and removed as constant aspiration was applied at the hub of the [facility] guide catheter with a 60 mL syringe, and at the hub of the [place] guide catheter with a 25 mL syringe. Following retrieval of the combination, the flow arrest was  reversed in the left internal carotid artery. The aspirate also contained more small chunks of clot. Back bleed at the hub of the [facility] guide catheter was established. A gentle control arteriogram performed through the 8 [place] FlowGate guide catheter demonstrated brisk flow into the left internal carotid artery intracranially, the left anterior cerebral artery, the left posterior  communicating artery, which improved. There was now complete revascularization of the dominant inferior division and also a branch of the superior division. The origin of the superior division remained occluded with a small stump. At this time, a Trevo ProVue 021 microcatheter inside of a Navien 5 French 115 cm guide catheter was advanced over a 0.014 inch Softip Synchro micro guidewire to the supraclinoid left ICA. With the micro guidewire leading with a J-tip configuration, access was obtained into the middle cerebral artery proximally. Thereafter using a torque device, access with the micro guidewire was obtained into the M2 region of the hypoplastic superior division followed by the microcatheter. The guidewire was removed. There was resistant aspiration of the tip of the microcatheter. This was gently retrieved until free aspiration of blood was seen at the hub. A 3 mm x 20 mm Trevo ProVue retrieval device was then advanced in a coaxial manner and with constant heparinized saline infusion to the distal end of the microcatheter. Again the O ring on the delivery microcatheter was loosened. With slight gentle traction with the right hand on the delivery micro guidewire, with the left hand the delivery microcatheter was retrieved. A gentle control arteriogram performed through the 5 [place] Navien guide catheter in the left internal carotid artery demonstrated no significant revascularization of the superior division. Proximal flow arrest was then initiated in the left internal carotid artery by inflating the balloon of the [facility] guide catheter. The combination of the retrieval device, the microcatheter and the Navien guide catheter were then gently retrieved as constant aspiration with a 60 mL syringe was applied at the hub of the [facility] guide catheter and with a 25 mL syringe at the hub of the Navien guide catheter. This combination was retrieved and removed as constant aspiration was applied at the hub of  the [facility] guide catheter as proximal flow arrest was reversed. Free back bleed of blood was noted at the hub of the [facility] guide catheter. A gentle contrast injection through the [facility] guide catheter demonstrated complete revascularization of the inferior division, the anterior cerebral artery and the posterior communicating artery into the posterior cerebral artery. There continued to be slow flow into the non dominant superior division. At least 3 aliquots of intra-arterial nitroglycerin were then given through the guide catheter in the left internal carotid artery. There was marginal improvement of the proximal half of the superior division of the middle cerebral artery. A final control arteriogram performed through the 8 [place] FlowGate guide catheter in the left internal carotid artery demonstrated a TICI 2b revascularization of the left MCA distribution. There continued to be slow flow in the superior division of the left middle cerebral artery. Throughout the procedure, the patient's blood pressure and neurological status remained stable. No evidence of extravasation or contrast blush was identified. The 8 [place] FlowGate guide catheter, and the 8 [place] neurovascular sheath were then retrieved and removed with the insertion of a 7 [place] Pinnacle sheath. This was then connected to continuous heparinized saline infusion. Throughout the procedure, the patient's blood pressure and neurological status remained stable. A Dyna CT of the head performed post procedure demonstrated areas of homogeneous  contrast accumulation in the caudate head, the caudate body, the putamen, and the left frontal subcortical white matter. The groin sheath and was connected to continuous heparinized saline infusion. The patient's distal pulses remained unchanged with the right sided dorsalis pedis, and posterior tibial regions being absent. A control arteriogram was then performed via the 7 French Pinnacle sheath in the left  common carotid artery which demonstrated an occluded left given the patient's pulseless right foot with evidence of significant arteriosclerotic disease in the artery which was deemed to be occluded with marginal distal reconstitution. A vascular surgical consultation was obtained. The patient was evaluated and felt to be a probable contact for acute revascularization. The left groin appeared soft without evidence of a hematoma or bleeding. IMPRESSION: Status post endovascular complete revascularization of the occluded left internal carotid artery distally in the T junction involving the left middle cerebral artery and the left anterior cerebral artery with 3 passes with the Embotrap 5 mm x 33 mm retrieval device, and 1 pass with the Trevo ProVue 3 mm x 20 mm retrieval device achieving a TICI 2b revascularization The patient was then transported to the neuro ICU awaiting vascular surgery consultation. PLAN: Return to the clinic in approximately 2-3 weeks post discharge from the hospital. Electronically Signed   By: Julieanne Cotton M.D.   On: 02-Nov-2017 10:52   Ct Cerebral Perfusion W Contrast  Result Date: 11/13/2017 CLINICAL DATA:  Initial evaluation for EXAM: CT ANGIOGRAPHY HEAD AND NECK CT PERFUSION BRAIN TECHNIQUE: Multidetector CT imaging of the head and neck was performed using the standard protocol during bolus administration of intravenous contrast. Multiplanar CT image reconstructions and MIPs were obtained to evaluate the vascular anatomy. Carotid stenosis measurements (when applicable) are obtained utilizing NASCET criteria, using the distal internal carotid diameter as the denominator. Multiphase CT imaging of the brain was performed following IV bolus contrast injection. Subsequent parametric perfusion maps were calculated using RAPID software. CONTRAST:  OMNIPAQUE IOHEXOL 350 MG/ML SOLN COMPARISON:  Prior head CT from earlier the same day. FINDINGS: CTA NECK FINDINGS Aortic arch: Visualized  aortic arch of normal caliber with normal 3 vessel morphology. Atheromatous plaque about the use arch and origin of the great vessels without hemodynamically significant stenosis. Visualized subclavian arteries widely patent Right carotid system: Scattered atheromatous plaque within the right common carotid artery without stenosis. Atherosclerotic change about the right bifurcation/proximal right ICA without significant stenosis. Right ICA widely patent distally to the skull base without stenosis, dissection, or occlusion. Left carotid system: Left common carotid artery patent from its origin to the bifurcation without significant stenosis. Abrupt occlusion of the left ICA just distal to the bifurcation. Left ICA remains occluded within the neck. Vertebral arteries: Both of the vertebral arteries arise from the subclavian arteries. Left vertebral artery dominant. Vertebral arteries patent within the neck without stenosis, dissection, or occlusion. Skeleton: No acute osseus abnormality. No discrete lytic or blastic osseous lesions. Moderate cervical spondylolysis at C4-5 through C6-7. Other neck: No other acute abnormality within the neck. Upper chest: Visualized upper chest demonstrates no acute finding. Severe emphysema. Review of the MIP images confirms the above findings CTA HEAD FINDINGS Anterior circulation: Left ICA remains occluded to the terminus. Left M1 segment and proximal left MCA branches occluded. Scant collateral flow seen distally. Multifocal atheromatous plaque throughout the cavernous/supraclinoid right ICA with moderate multifocal stenosis right M1 widely patent. No proximal right M2 occlusion distal right MCA branches demonstrate atheromatous irregularity but are perfused to their distal aspects. Right  A1 patent. Retrograde opacification of the left A1 which appears slightly hypoplastic. Azygos ACA widely patent to its distal aspects. Posterior circulation: Vertebral arteries patent to the  vertebrobasilar junction without stenosis. Left vertebral artery dominant posterior inferior cerebral arteries patent bilaterally. Basilar patent to its distal aspect without stenosis. Superior cerebral arteries patent bilaterally. Both of the PCAs patent to their distal aspects. Prominent posterior communicating arteries noted bilaterally. Venous sinuses: Patent. Anatomic variants: None significant. Delayed phase: 5 mm focus of parenchymal enhancement along the gray-white matter differentiation of the anterior right frontal lobe noted (series 13, image 20), indeterminate, but favored to be vascular in nature. No surrounding edema or other abnormality. No other abnormal enhancement. Review of the MIP images confirms the above findings CT Brain Perfusion Findings: CBF (<30%) Volume: 40mL Perfusion (Tmax>6.0s) volume: Mismatch Volume: Infarction Location:Evidence for acute ischemic infarct involving the left MCA distribution, with involvement of the left insular region and subcortical and deep white matter of the left cerebral hemisphere. Surrounding ischemic penumbra involves much of the left cerebral hemisphere, with additional scattered perfusion abnormality within the right cerebral hemisphere. Findings are felt to be at least partially artifactual in nature. IMPRESSION: 1. Acute EVLO with occlusion of the left ICA just distal to the bifurcation. Left ICA remains occluded to the terminus with absent flow within the left MCA artery. Fairly mild collateralization seen distally within the left MCA distribution. 2. Evidence for core infarct involving the left cerebral hemisphere as above. Surrounding ischemic penumbra with perfusion mismatch volume of 483 mL, suspected to at least in part be artifactual in nature on this examination. However, a fairly substantial surrounding penumbra is still somewhat suspected. 3. Patent azygos ACA. 4. Moderate atheromatous plaque throughout the right carotid siphon with  moderate multifocal stenosis. No other high-grade or hemodynamically significant stenosis identified. 5. Emphysema. Critical Value/emergent results were called by telephone at the time of interpretation on 11/05/2017 at 12:35 am to Dr. Reesa Chew , who verbally acknowledged these results. Electronically Signed   By: Rise Mu M.D.   On: 11/17/2017 00:56   Dg Chest Port 1 View  Result Date: 10/31/2017 CLINICAL DATA:  Respiratory failure EXAM: PORTABLE CHEST 1 VIEW COMPARISON:  10/30/2017 FINDINGS: Endotracheal tube and feeding catheter are seen and in satisfactory position. Cardiac shadow is enlarged but stable. Flu lungs are well aerated bilaterally without focal infiltrate or sizable effusion. No acute bony abnormality is seen. IMPRESSION: No acute abnormality noted. Electronically Signed   By: Alcide Clever M.D.   On: 10/31/2017 09:01   Dg Chest Port 1 View  Result Date: 10/30/2017 CLINICAL DATA:  77 year old male with emergent large vessel occlusion of the left ICA status post endovascular revascularization. Intubated. EXAM: PORTABLE CHEST 1 VIEW COMPARISON:  11/15/2017 and earlier. FINDINGS: Portable AP semi upright view at 0618 hours. Endotracheal tube tip is in good position between the level the clavicles and carina. Stable cardiomegaly and mediastinal contours. Mildly regressed widespread increased interstitial opacity. Continued hypo ventilation at the left lung base. No pneumothorax. No large pleural effusion. Calcified aortic atherosclerosis. IMPRESSION: 1. Endotracheal tube in good position. 2. Regressed pulmonary interstitial opacity since yesterday compatible with regressed edema. 3. Cardiomegaly with left lung base hypo ventilation. Electronically Signed   By: Odessa Fleming M.D.   On: 10/30/2017 08:28   Portable Chest X-ray  Result Date: 11/20/2017 CLINICAL DATA:  Femoral embolectomy.  Preoperative exam. EXAM: PORTABLE CHEST 1 VIEW COMPARISON:  Chest x-ray 03/2017, 01/26/2012. FINDINGS:  Endotracheal tube tip  noted 1.8 cm above the lower portion of the carina. 1-2 cm proximal repositioning should be considered. Cardiomegaly with bilateral mild interstitial prominence again noted suggesting mild CHF. Right lower lobe atelectasis/infiltrate. No prominent pleural effusion or pneumothorax. IMPRESSION: 1. Endotracheal tube tip noted 1.8 cm above the lower portion of the carina. 1-2 cm proximal repositioning should be considered. 2. Cardiomegaly with mild bilateral interstitial prominence again noted suggesting mild CHF. 3.  Right lower lobe atelectasis/infiltrate. Electronically Signed   By: Maisie Fus  Register   On: 11/16/2017 07:17   Dg Chest Port 1 View  Result Date: 11/12/2017 CLINICAL DATA:  Initial evaluation for acute stroke. EXAM: PORTABLE CHEST 1 VIEW COMPARISON:  None available. FINDINGS: Cardiomegaly. Mediastinal silhouette within normal limits. Aortic atherosclerosis. Lungs normally inflated. Underlying emphysematous changes. Diffusely increased vascular congestion with interstitial prominence suggests a degree of superimposed pulmonary interstitial edema. No consolidative airspace disease. No pleural effusion. No pneumothorax. No acute osseus abnormality. IMPRESSION: 1. Cardiomegaly with mild diffuse pulmonary interstitial edema/congestion. 2. Underlying emphysema. 3. Aortic atherosclerosis. Electronically Signed   By: Rise Mu M.D.   On: 11/14/2017 01:16   Ct Head Code Stroke Wo Contrast  Result Date: 10/28/2017 CLINICAL DATA:  Code stroke. Initial evaluation for acute right-sided weakness. EXAM: CT HEAD WITHOUT CONTRAST TECHNIQUE: Contiguous axial images were obtained from the base of the skull through the vertex without intravenous contrast. COMPARISON:  None available. FINDINGS: Brain: Generalized age-related cerebral atrophy with chronic small vessel ischemic disease. Remote lacunar infarcts within the left basal ganglia and thalamus. There is subtle asymmetric  hypodensity at the posterior left insula extending posteriorly into the left M6 territory, with additional possible hypodensity within the left M2 territory, concerning for evolving acute ischemic left MCA territory infarct. No acute intracranial hemorrhage. No mass lesion, midline shift or mass effect. No hydrocephalus. No extra-axial fluid collection. Vascular: Asymmetric hyperdensity involving the left M1 segment extending into proximal left M2 branches, concerning for low thrombus in large vessel occlusion. Scattered vascular calcifications noted within the carotid siphons. Skull: Scalp soft tissues and calvarium within normal limits. Sinuses/Orbits: Globes and orbital soft tissues within normal limits. Paranasal sinuses and mastoids are clear. Other: None. ASPECTS [facility] Stroke Program Early CT Score) - Ganglionic level infarction (caudate, lentiform nuclei, internal capsule, insula, M1-M3 cortex): 5 - Supraganglionic infarction (M4-M6 cortex): 2 Total score (0-10 with 10 being normal): 7 IMPRESSION: 1. Asymmetric hyperdensity within the left M1 segment, concerning for large vessel occlusion. Subtle early hypodensity within the left cerebral hemisphere concerning for acute left MCA territory infarct. No acute intracranial hemorrhage. 2. ASPECTS is 7. Critical Value/emergent results were called by telephone at the time of interpretation on 10/28/2017 at 11:40 pm to Dr. Lesly Rubenstein SUNG , who verbally acknowledged these results. Electronically Signed   By: Rise Mu M.D.   On: 10/28/2017 23:42   Ir Angio Intra Extracran Sel Com Carotid Innominate Bilat Mod Sed  Result Date: November 30, 2017 INDICATION: Sudden onset of right-sided hemiplegia, left gaze deviation, and global aphasia. Occluded left internal carotid artery, the left middle cerebral artery and the left anterior cerebral artery on CT angiogram. EXAM: 1. EMERGENT LARGE VESSEL OCCLUSION THROMBOLYSIS (anterior CIRCULATION) COMPARISON:  CT angiogram of  the head and neck of 11/03/2017. MEDICATIONS: Ancef 2 g IV antibiotic was administered within 1 hour of the procedure. ANESTHESIA/SEDATION: General anesthesia. CONTRAST:  Isovue 300 approximately 140 mL FLUOROSCOPY TIME:  Fluoroscopy Time: 73 minutes 12 seconds (3673 mGy). COMPLICATIONS: None im none immediate. TECHNIQUE: Following a full explanation of  the procedure along with the potential associated complications, an informed witnessed consent was obtained from the patient's daughter-in-law and his spouse. The risks of intracranial hemorrhage of 10%, worsening neurological deficit, ventilator dependency, death and inability to revascularize were all reviewed in detail with the patient's spouse and daughter-in-law. The patient was then put under general anesthesia by the Department of Anesthesiology at [facility]. The left groin was prepped and draped in the usual sterile fashion. Thereafter using modified Seldinger technique, transfemoral access into the right common femoral artery was obtained without difficulty. Over a 0.035 inch guidewire a 5 French Pinnacle sheath was inserted. Through this, and also over a 0.035 inch guidewire a 5 [place] JB 1 catheter was advanced to the aortic arch region and selectively positioned in the left common carotid artery. FINDINGS: The left common carotid arteriogram demonstrates the left external carotid artery and its major branches to be widely patent. The left internal carotid artery at the bulb appears patent with slow ascent of contrast to the cranial skull base. Complete occlusion is noted at the level of the proximal cavernous segment. Normal distal reconstitution is noted from the external carotid artery branches. PROCEDURE: ENDOVASCULAR REVASCULARIZATION OF OCCLUDED LEFT INTERNAL CAROTID ARTERY TERMINUS T OCCLUSION, THE LEFT MIDDLE CEREBRAL ARTERY AND THE LEFT ANTERIOR CEREBRAL ARTERY WITH 3 PASSES WITH THE EMBOTRAP 5 MM X 33 MM RETRIEVAL DEVICE, AND 1 PASS  WITH THE 3 MM X 20 MM TREVO PROVUE RETRIEVAL DEVICE ACHIEVING A TICI 2B REVASCULARIZATION. The diagnostic JB 1 catheter in the left common carotid artery was exchanged over a 0.035 inch 300 cm Rosen exchange guidewire for an 8 French 55 cm Brite tip neurovascular sheath using biplane roadmap technique and constant fluoroscopic guidance. A good aspiration was obtained from the hub of the neurovascular sheath. This was then connected to continuous heparinized saline infusion. Over the [employer] guidewire, an 8 [place] 85 cm FlowGate balloon guide catheter which had been prepped with 50% contrast and 50% heparinized saline infusion was positioned just proximal to the left common carotid bifurcation. The guidewire was removed. Good aspiration obtained from the hub of the [facility] guide catheter. Using biplane roadmap technique and constant fluoroscopic guidance, over a 0.035 inch [place] guidewire, an 8 [place] FlowGate guide catheter was advanced to the mid cervical left ICA. Again good aspiration obtained from the hub of this guide catheter with no evidence of dissections or occlusions at the site of the tip. However, there continued be completely occluded left internal carotid artery as described above. Over a 0.014 inch Softip Synchro micro guidewire, a combination of an 021 Trevo ProVue microcatheter inside a 5 French 115 cm Navien guide catheter was advanced to the distal end of the [facility] guide catheter. With the micro guidewire leading with a J-tip configuration, the combination was navigated to the proximal cavernous segment. Thereafter using a torque device, the micro guidewire was gently manipulated through the occluded left internal carotid artery cavernous segment, the supraclinoid segment into the middle cerebral artery inferior division M2 M3 region. The guidewire was removed. Good aspiration obtained from the hub of the microcatheter. A gentle contrast injection demonstrated stasis which cleared  with slight retrieval of the tip of the microcatheter. At this time, a 5 mm x 33 mm Embotrap retrieval device was advanced to the distal end of the microcatheter using biplane roadmap technique and constant fluoroscopic guidance in a coaxial manner and with constant heparinized saline infusion. The proximal and the distal markers were then positioned  as planned. A gentle contrast injection through the Navien guide catheter demonstrated no revascularization. Proximal flow arrest was then initiated by inflating the balloon in the left internal carotid artery of the [facility] guide catheter. With constant aspiration being applied with a 60 mL syringe at the hub of the [facility] guide catheter, and with a 25 mL syringe at the hub of the Navien guide catheter, the combination of the retrieval device, the microcatheter and Navien guide catheter was gently retrieved and removed. Aspiration was continued as the balloon was then deflated. The aspirate contained small chunks of dark clot in the interstices of the retrieval device. A few specks were also noted in the aspirate. Free back bleed of blood was noted at the hub of the [facility] guide catheter. A gentle contrast injection through the [facility] guide catheter in the left internal carotid artery demonstrated only minimal improvement of the ascent of contrast with now the ophthalmic artery and the posterior communicating arteries opacified though with large chunks of clot in the distal cavernous and the supraclinoid segments. A second pass was then made with a combination as described above. Again after having deployed the retrieval device such that the proximal portion was at the proximal portion of the clot, and with proximal flow arrest by inflating the balloon in the left internal carotid artery of the [facility] guide catheter, the combination of the retrieval device, the Navien guide catheter, and the microcatheter were retrieved and removed as constant aspiration was  applied with a 60 mL syringe at the hub of the [facility] guide catheter and the Navien guide catheter. Aspiration was continued as the balloon was deflated in the left internal carotid artery. Free back bleed of blood was noted at the hub of the [facility] guide catheter. A control arteriogram performed following this pass now demonstrated a complete revascularization of the left ICA supraclinoid segment, and the left anterior cerebral artery and the proximal half of the left middle cerebral artery. Contrast was seen to extend into the dominant inferior division with multiple areas of filling defects consistent with clot and middle 1/3 occluded. Also noted was an occluded hypoplastic superior division. The aspirate contained large chunks of clot as well as in the interstices of the retrieval device. A third pass was then made again with the combination this time a 5 [place] 125 cm [place] guide catheter inside of which was an 021 Trevo ProVue microcatheter which were advanced in combination over a 0.014 inch Softip Synchro micro guidewire. The micro guidewire was advanced through a large clot in the proximal inferior division and into the inferior branch of a division of this in the M2 M3 region. Good aspiration of blood was obtained from the hub of the microcatheter. A gentle contrast injection demonstrated antegrade flow into the M3 branch of the inferior division. An Embotrap 5 mm x 33 mm device was again advanced to the distal tip of the microcatheter as described previously. The proximal and the distal landing zones were then identified. Thereafter, O ring on the delivery microcatheter was loosened. With slight forward gentle traction with the right hand on the delivery micro guidewire, with the left hand the delivery microcatheter was retrieved unsheathing the distal portion of the device, and the proximal portions. A control arteriogram performed through the Navien guide catheter in the distal left internal carotid  artery demonstrated a TICI 2b reperfusion. Again with proximal flow arrest in the proximal left internal carotid artery as described above, and constant aspiration being applied with  a 60 mL syringe at the hub of the [facility] guide catheter and with a 25 mL syringe with the hub of the [place] guide catheter. The retrieval device, the microcatheter and the [facility] guide catheter were then gently retrieved and removed as constant aspiration was applied at the hub of the [facility] guide catheter with a 60 mL syringe, and at the hub of the [place] guide catheter with a 25 mL syringe. Following retrieval of the combination, the flow arrest was reversed in the left internal carotid artery. The aspirate also contained more small chunks of clot. Back bleed at the hub of the [facility] guide catheter was established. A gentle control arteriogram performed through the 8 [place] FlowGate guide catheter demonstrated brisk flow into the left internal carotid artery intracranially, the left anterior cerebral artery, the left posterior communicating artery, which improved. There was now complete revascularization of the dominant inferior division and also a branch of the superior division. The origin of the superior division remained occluded with a small stump. At this time, a Trevo ProVue 021 microcatheter inside of a Navien 5 French 115 cm guide catheter was advanced over a 0.014 inch Softip Synchro micro guidewire to the supraclinoid left ICA. With the micro guidewire leading with a J-tip configuration, access was obtained into the middle cerebral artery proximally. Thereafter using a torque device, access with the micro guidewire was obtained into the M2 region of the hypoplastic superior division followed by the microcatheter. The guidewire was removed. There was resistant aspiration of the tip of the microcatheter. This was gently retrieved until free aspiration of blood was seen at the hub. A 3 mm x 20 mm Trevo ProVue retrieval  device was then advanced in a coaxial manner and with constant heparinized saline infusion to the distal end of the microcatheter. Again the O ring on the delivery microcatheter was loosened. With slight gentle traction with the right hand on the delivery micro guidewire, with the left hand the delivery microcatheter was retrieved. A gentle control arteriogram performed through the 5 [place] Navien guide catheter in the left internal carotid artery demonstrated no significant revascularization of the superior division. Proximal flow arrest was then initiated in the left internal carotid artery by inflating the balloon of the [facility] guide catheter. The combination of the retrieval device, the microcatheter and the Navien guide catheter were then gently retrieved as constant aspiration with a 60 mL syringe was applied at the hub of the [facility] guide catheter and with a 25 mL syringe at the hub of the Navien guide catheter. This combination was retrieved and removed as constant aspiration was applied at the hub of the [facility] guide catheter as proximal flow arrest was reversed. Free back bleed of blood was noted at the hub of the [facility] guide catheter. A gentle contrast injection through the [facility] guide catheter demonstrated complete revascularization of the inferior division, the anterior cerebral artery and the posterior communicating artery into the posterior cerebral artery. There continued to be slow flow into the non dominant superior division. At least 3 aliquots of intra-arterial nitroglycerin were then given through the guide catheter in the left internal carotid artery. There was marginal improvement of the proximal half of the superior division of the middle cerebral artery. A final control arteriogram performed through the 8 [place] FlowGate guide catheter in the left internal carotid artery demonstrated a TICI 2b revascularization of the left MCA distribution. There continued to be slow flow in the  superior division of the left middle  cerebral artery. Throughout the procedure, the patient's blood pressure and neurological status remained stable. No evidence of extravasation or contrast blush was identified. The 8 [place] FlowGate guide catheter, and the 8 [place] neurovascular sheath were then retrieved and removed with the insertion of a 7 [place] Pinnacle sheath. This was then connected to continuous heparinized saline infusion. Throughout the procedure, the patient's blood pressure and neurological status remained stable. A Dyna CT of the head performed post procedure demonstrated areas of homogeneous contrast accumulation in the caudate head, the caudate body, the putamen, and the left frontal subcortical white matter. The groin sheath and was connected to continuous heparinized saline infusion. The patient's distal pulses remained unchanged with the right sided dorsalis pedis, and posterior tibial regions being absent. 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 A vascular surgical consultation was obtained. The patient was evaluated and felt to be a probable contact for acute revascularization. The left groin appeared soft without evidence of a hematoma or bleeding. IMPRESSION: Status post endovascular complete revascularization of the occluded left internal carotid artery distally in the T junction involving the left middle cerebral artery and the left anterior cerebral artery with 3 passes with the Embotrap 5 mm x 33 mm retrieval device, and 1 pass with the Trevo ProVue 3 mm x 20 mm retrieval device achieving a TICI 2b revascularization The patient was then transported to the neuro ICU awaiting vascular surgery consultation. PLAN:  Return to the clinic in approximately 2-3 weeks post discharge from the hospital. Electronically Signed   By: Julieanne Cotton M.D.   On: 11/10/2017 10:52      HISTORY OF PRESENT ILLNESS  Hunter Adkins is a 77 y.o. male past medical history of atrial fibrillation on Coumadin, congestive heart failure COPD history of lower extremity DVT, alcohol abuse, who was in his usual state of health at a bar at a pool tournament and had sudden onset of aphasia and right-sided weakness at 2226 hrs on 10/28/2017, witnessed by family members.  He was brought in for evaluation to [facility] hospital.  Evaluated by telemedicine neurology.  Found to have mutism, left gaze preference, right hemianopsia, right-sided hemiplegia with a noncontrast CT of the head showing evolving left MCA territory stroke.  He was not a candidate for TPA because he was therapeutic INR on Coumadin.  CT Angie head and neck showed a left ICA occlusion at the origin as well as left M1 and left A1 occlusion.  He was transferred to [facility] for endovascular thrombectomy.  LKW: 2226 hrs. on 10/28/2017 tpa given?: no, on Coumadin-therapeutic INR Premorbid modified Rankin scale (mRS): 0  HOSPITAL COURSE Hunter Adkins is a 77 y.o. male with history of CHF, cardiomyopathy, A. fib on Coumadin, history of DVT, alcohol use admitted for aphasia, right-sided weakness, left gaze and right knee leg. No tPA given due to therapeutic INR.    Stroke:  Left hemisphere infarct due to left ICA occlusion status post thrombectomy, embolic secondary to A. fib  Resultant intubated and sedated with right hemiplegia  CT head left MCA hyperdense sign, left MCA territory subtle infarct  CTA head and neck left ICA, MCA, and ACA occlusion, moderate right ICA bulb and siphon atherosclerosis  MRI large left MCA infarct  and multifocal small b/l MCA infarcts  MRA left MCA and ICA patent with b/l ICA siphon athero  2D Echo EF 40 to 45%  LDL  67  HgbA1c 5.8  SCDs for VTE prophylaxis  warfarin daily prior to admission, now on no antithrombotics given poor prognosis and comfort care  Disposition:  given poor prognosis, family requested comfort care measures. Pt was extubated and on morphine drip before expiration.    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  Had emergent right femoral embolectomy  Had right common femoral artery enterectomy with vein patch angioplasty  Had right LE 4 compartment fasciotomies  Wound VAC at right LE - removed by VVS  Afib on coumadin  INR 2.61, therapeutic  No TPA due to therapeutic INR  Acute blood loss anemia  Hemoglobin 13.5-> 8.9->8.2->7.8->7.2  Likely related to current procedure and surgeries  Hypotension  Stable on the low side  Off neo due to comfort care   Hyperlipidemia  Home meds: Zocor  LDL 69, goal < 70  Dysphagia   extubated   NPO  Other Stroke Risk Factors  Advanced age  Former cigarette smoker quit 6 years ago  ETOH use -on folic acid and B1  Cardiomyopathy - EF 40 to 45%  History of DVT  Other Active Problems  Comfort care measures   DISCHARGE EXAM Pt deceased.   25 minutes were spent preparing discharge.  Marvel Plan, MD PhD Stroke Neurology November 23, 2017 12:42 PM

## 2017-11-29 NOTE — Consult Note (Signed)
WOC follow-up: Reviewed progress notes in the EMR.  Pt plans for one-way extubation from vent today and has comfort care goals.  Discussed plan of care with bedside nurse via phone call.  Vac was removed yesterday and vascular team has requested moist gauze dressing. No further role for WOC team; please refer to vascular team for further questions. Please re-consult if further assistance is needed.  Thank-you,  Cammie Mcgeeawn Trinidad Petron MSN, RN, CWOCN, SterlingWCN-AP, CNS [phone]

## 2017-11-29 NOTE — Progress Notes (Addendum)
PULMONARY / CRITICAL CARE MEDICINE   Name: KUSHAL SAUNDERS MRN: [id number] DOB: [birth year]    ADMISSION DATE:  11/06/2017 CONSULTATION DATE:  11/24/2017  REFERRING MD:  Rory Percy  CHIEF COMPLAINT:  AMS  HISTORY OF PRESENT ILLNESS:   Hunter Adkins is a 77 y.o. male former smoker quit 2013 with PMH as outlined below including but not limited to AF on coumadin, CHF, COPD, DVT, EtOH abuse.  He was in his usual state of health evening of 7/31 and was at a bar at a pool tournament when he had sudden onset of aphasia and right sided weakness around 2226.  He was taken to [facility] ED where he was found to have mutism, left gaze preference, right hemianopsia, right hemiplegia.  CT of head showed evolving left MCA infarct and CTA showed left ICA occlusion and left M1 and A1 occlusion.  He was deemed to not be a candidate for tPA due to being therapeutic on coumadin.  He was subsequently transferred to [facility] where he was taken to IR and had left common carotid arteriogram followed by complete revascularization of occluded left ICA, left MCA, left ACA.  Post procedure, he returned to the ICU on vent and PCCM was asked to assist with vent management.  Of note, he was also found to have cold RLE prior to IR procedure with no palpable pulses.  Vascular had been consulted 8/1 and patient went for fasciotomy of RLE.  Repeat MRI 8/3 showing extensive edema with right 43m rightward midline shift and left uncal herniation with minimal intraventricular hemorrhage; hypertonic saline started.  Neurosurgery consulted, poor prognosis given, and surgery felt not likely to change poor outcome.  Pallative care medicine consulted with plans for one-way extubation 8/5.   SUBJECTIVE:  No changes overnight. Remains on propofol and 3% saline and unresponsive.  Remains tachypneic.   Family present and ready to transition to one-way extubation.  VITAL SIGNS: BP (!) 142/90   Pulse 82   Temp (!) 97.5 F (36.4 C) (Axillary)   Resp (!) 29    Ht '5\' 6"'$  (1.676 m)   Wt 168 lb 3.4 oz (76.3 kg)   SpO2 98%   BMI 27.15 kg/m   HEMODYNAMICS:    VENTILATOR SETTINGS: Vent Mode: PRVC FiO2 (%):  [30 %] 30 % Set Rate:  [16 bmp] 16 bmp Vt Set:  [510 mL] 510 mL PEEP:  [5 cmH20] 5 cmH20 Plateau Pressure:  [18 cmH20] 18 cmH20  INTAKE / OUTPUT: I/O last 3 completed shifts: In: 5225.4 [I.V.:3368.8; NG/GT:1700; IV Piggyback:156.6] Out: 1890 [Urine:1850; Drains:40]  PHYSICAL EXAMINATION: General:  Critically ill male on MV in NAD HEENT: pupils 3/reactive, ETT/ OGT Neuro: unresponsive to noxius stimuli on propofol at 50 mcg/kg/min CV: IRIR PULM: tachypneic over MV breaths ~30, lungs CTA GI: soft, +BS Musculoskeletal: RLE fasciotomy wound to suction Extremities: no edema   LABS:  BMET Recent Labs  Lab 10/30/17 0620 10/31/17 0045  11/01/17 0510  11/01/17 1444 11/01/17 2044 [id number]  NA 142 143   < > 152*   < > 158* 153* 160*  K 3.4* 3.5  --  3.7  --   --   --   --   CL 112* 114*  --  125*  --   --   --   --   CO2 23 23  --  22  --   --   --   --   BUN 10 11  --  12  --   --   --   --  CREATININE 1.22 1.04  --  0.95  --   --   --   --   GLUCOSE 102* 139*  --  136*  --   --   --   --    < > = values in this interval not displayed.    Electrolytes Recent Labs  Lab 10/30/17 0620 10/31/17 0045 11/01/17 0510  CALCIUM 7.8* 7.8* 7.6*  MG 1.8 2.2  --   PHOS 2.5  --   --     CBC Recent Labs  Lab 10/30/17 0620 10/31/17 0045 11/01/17 0510  WBC 8.7 8.6 6.6  HGB 7.8* 7.5* 7.2*  HCT 23.7* 22.3* 22.1*  PLT 185 165 166    Coag's Recent Labs  Lab 10/28/17 2315 10/30/17 0620 10/31/17 1506 11/01/17 0510  APTT 42*  --   --   --   INR 2.61 2.29 2.07 1.75    Sepsis Markers Recent Labs  Lab 10/30/17 0620  LATICACIDVEN 1.0    ABG Recent Labs  Lab 11/08/2017 0807 11/04/2017 1114 10/30/17 0422  PHART 7.360 7.383 7.417  PCO2ART 39.5 39.2 36.4  PO2ART 406.0* 425.0* 103    Liver Enzymes Recent Labs   Lab 10/28/17 2315 10/30/17 0620 10/31/17 0045  AST '21 19 17  '$ ALT '13 11 9  '$ ALKPHOS 58 42 44  BILITOT 0.8 0.8 0.7  ALBUMIN 4.0 2.8* 2.6*    Cardiac Enzymes Recent Labs  Lab 10/28/17 2315  TROPONINI <0.03    Glucose Recent Labs  Lab 11/01/17 1146 11/01/17 1630 11/01/17 1951 11/01/17 2308 11-24-17 0305 11-24-2017 0753  GLUCAP 108* 90 102* 105* 122* 128*    Imaging No results found.   STUDIES:  MRI brain 8/3 >> 1. Large acute left MCA infarct with extensive edema, 5 mm of rightward midline shift, and left uncal herniation. Associated petechial hemorrhage without malignant hemorrhagic transformation. 2. Minimal intraventricular hemorrhage. 3. Additional small acute infarcts scattered throughout both cerebral hemispheres. 4. Chronic left basal ganglia and left thalamic lacunar infarcts. 5. Persistent patency of the intracranial left ICA and left MCA following recent revascularization. 6. Mild-to-moderate bilateral ICA stenoses.  CULTURES: None pending   ANTIBIOTICS: None  LINES/TUBES: ETT >> Art line 8/5  DISCUSSION: 77 y.o. male with hx AF on coumadin with massive ischemic stroke in the left middle cerebral distribution s/p IR  with substantial mass effect and shift and hemorraghic transformation, and complications of ischemic RLE s/p fasciotomy. Poor prognosis.   ASSESSMENT / PLAN:  Left hemisphere CVA secondary to Left ICA occlusion s/p IR thrombectomy, embolic secondary to Afib, with hemorraghic conversion and mass effect edema and shift with left uncal herniation.   -DNR as of 8/4  P:  Family present and ready for one-way extubation.  Met with wife, daughters, and other family members to discuss and confirm prior to proceeding.  Additionally, dicussed with Dr. Erlinda Hong and palliative care.   Continue propofol.  Will start morphine gtt, and ativan prn and gtt as needed with robinul prn and withdrawal order set.  Will d/c hypertonic saline, aline, and other  non-comfort meds.  Once patient is comfortable, less tachypneic, will proceed with one-way extubation with family present, however wife expresses she does not want to be present.    CCT 45 mins  Kennieth Rad, AGACNP-BC [facility] Pgr: [phone] or if no answer [phone] 24-Nov-2017, 9:17 AM

## 2017-11-29 NOTE — Progress Notes (Signed)
   11/22/2017 0900  Clinical Encounter Type  Visited With Health care provider;Patient  Visit Type Follow-up  Referral From Nurse  Consult/Referral To Chaplain  Spiritual Encounters  Spiritual Needs Emotional;Grief support  Stress Factors  Patient Stress Factors Not reviewed  Family Stress Factors Major life changes  Chaplain was called to visit with the family but the family was not present in the room.  Chaplain was informed there was a planned extubation that would take place today.  Chaplain will be present on the unit today for any family needs.

## 2017-11-29 NOTE — Progress Notes (Signed)
   Noted plans for one-way extubation.  Both feet appear well-perfused.  He has a wet-to-dry dressing on his fasciotomy sites.  I will be available as needed.  Lemar LivingsBrandon Cain, MD

## 2017-11-29 NOTE — Progress Notes (Signed)
Daily Progress Note   Patient Name: Hunter Adkins       Date: 14-Nov-2017 DOB: [birth year]  Age: 77 y.o. MRN#: [id number] Attending Physician: Rosalin Hawking, MD Primary Care Physician: [place] Pink, MD Admit Date: 11/19/2017  Reason for Consultation/Follow-up: Non pain symptom management, Pain control, Psychosocial/spiritual support, Terminal Care and Withdrawal of life-sustaining treatment  Subjective: Patient remains unresponsive and on ventilator. He is on Propofol and Neo. No changes overnight. He is tachypneic. Family is at the bedside and express their wishes to proceed with one-way extubation once medical team is ready.   I met with the family and we reviewed our goals of care discussion on yesterday and provided updates to family members who were not present. We discussed the process of extubation and that the medical team would not proceed with the process until patient appeared comfortable, respiratory status was more controlled. Family aware that patient is receiving all medications via IV drips to allow continuous infusion. They are aware that the nurses will be able to administer boluses of medication from the IV infusions if patient shows signs and symptoms of discomfort, agitation, shortness of breath, or pain. Wife and other family members verbalized understanding and their wishes to make sure he was comfortable during and after the extubation process. Wife verbalizes she does not want to be in the room during extubation but would like to remain outside of the room in case patient passes away immediately. We discussed the process after extubation will be to continue to closely monitor him for nonverbal signs of stress such as increased heart rate or respiratory rate. Family questions how  long the dying process would take. Advised family that we do not have a definitive way to determine how long it will take for the patient to pass away. They are aware that it could happen rather quickly after extubation, several minutes, hours, or days. Family verbalized understanding and appreciation. Wife is fearful he will suffer or linger on. Advised that the nurses that are caring for him will do an awesome job assessing him for signs of discomfort and managing them. Wife verbalized understanding. Family is aware that they may spend as much time as they would like with the patient once extubated and also after he has passed away.   Chaplain was also at the bedside for  support.   Length of Stay: 4  Current Medications: Scheduled Meds:  .  stroke: mapping our early stages of recovery book   Does not apply Once  . sodium chloride   Intravenous Once  . chlorhexidine gluconate (MEDLINE KIT)  15 mL Mouth Rinse BID  . mouth rinse  15 mL Mouth Rinse 10 times per day    Continuous Infusions: . LORazepam (ATIVAN) infusion    . morphine    . propofol (DIPRIVAN) infusion      PRN Meds: [DISCONTINUED] acetaminophen **OR** [DISCONTINUED] acetaminophen (TYLENOL) oral liquid 160 mg/5 mL **OR** acetaminophen, albuterol, glycopyrrolate, LORazepam, morphine, nitroGLYCERIN  Physical Exam  Constitutional: He appears well-developed. He is intubated.  Cardiovascular: An irregular rhythm present. Tachycardia present. Exam reveals decreased pulses.  Pulmonary/Chest: He is intubated. He has decreased breath sounds.  Nursing note and vitals reviewed.           Vital Signs: BP (!) 142/90   Pulse 82   Temp (!) 97.5 F (36.4 C) (Axillary)   Resp (!) 29   Ht _0  (1.676 m)   Wt 76.3 kg (168 lb 3.4 oz)   SpO2 98%   BMI 27.15 kg/m  SpO2: SpO2: 98 % O2 Device: O2 Device: Ventilator O2 Flow Rate:    Intake/output summary:   Intake/Output Summary (Last 24 hours) at 2017-11-13 1008 Last data filed at  11/13/17 0600 Gross per 24 hour  Intake 2777 ml  Output 700 ml  Net 2077 ml   LBM: Last BM Date: 10/31/17 Baseline Weight: Weight: 70 kg (154 lb 5.2 oz) Most recent weight: Weight: 76.3 kg (168 lb 3.4 oz)       Palliative Assessment/Data:PPS 0%     Patient Active Problem List   Diagnosis Date Noted  . Acute ischemic stroke ([place]) 11/20/2017  . Middle cerebral artery embolism, left 11/23/2017  . Acute respiratory failure ([place])   . Endotracheally intubated     Palliative Care Assessment & Plan   Patient Profile: 77 y.o. male transferred to [facility] from [facility] on 11/07/2017 after presenting to their ED with complaints of right-sided weakness and sudden onset of aphasia while at a pool tournament with family. He has a past medical history significant for atrial fibrillation (Coumadin), CHF, COPD, DVT, cardiomyopathy, and alcohol abuse. According to family this event was witnessed on 10/28/17 at 2226. During ED course he was found to have mutism, left gaze preference, right hemianopsia, and right-sided hemiplegia. CT of head shoed evolving left MCA territory stroke. He had a therapeutic INR on Coumadin and not a TPA candidate. CT of head and neck showed left ICA occlusion at the origin as well as left M1 and left A1 occlusion. Since admission he underwent endovascular thymectomy by IR. He remains intubated with a substantial mass-effect and shift. Palliative Medicine team consulted for goals of care.   Recommendations/Plan:  DNR/DNI-as confirmed by family  Patient was successfully extubated and comfort care was provided.  was pronounced dead at 1221 by RN and confirmed by myself. Family was at bedside during death.   Goals of Care and Additional Recommendations:  Limitations on Scope of Treatment: Full Comfort Care  Code Status:    Code Status Orders  (From admission, onward)        Start     Ordered   2017-11-13 0841  DNR (Do not attempt resuscitation)  Continuous      Question Answer Comment  In the event of cardiac or respiratory ARREST Do not call  a "code blue"   In the event of cardiac or respiratory ARREST Do not perform Intubation, CPR, defibrillation or ACLS   In the event of cardiac or respiratory ARREST Use medication by any route, position, wound care, and other measures to relive pain and suffering. May use oxygen, suction and manual treatment of airway obstruction as needed for comfort.   Comments Currently intubated      11-22-2017 0841    Code Status History    Date Active Date Inactive Code Status Order ID Comments User Context   11/01/2017 1338 11/22/17 0841 DNR [id number]  Jimmy Footman, NP Inpatient   11/09/2017 0533 11/01/2017 1338 Full Code [id number]  Luanne Bras, MD Inpatient   11/09/2017 0159 11/25/2017 0533 Full Code [id number]  Amie Portland, MD Inpatient     ADDENDUM: Returned to patient's room to support family in preparation for one-way extubation. Family emotional during this time but prepared for process to begin. Wife, children, grandchildren, and siblings all at the bedside prior to extubation. Patient was successfully extubated with no family present during process. Patient tolerated extubation without difficulty or signs of distress. Additional tubes/drains etc were removed and room was cleaned for family presence. Family was guided back to the bedside were they were able to spend time with family and show support to each other. I remained with family for support. We discussed signs and appearance that patient is comfortable. No use of accessory muscles. Wife verbalized her appreciation and gratefulness of the medical team and that patient appears comfortable and at peace. Family very emotional and support was given. I spent time allowing family to reminisce on family members of their loved one and express their love to him. Wife spent some time alone with her husband as well. She asked process of what would happen after he  passed away. Educated family that they may spend as much time as they would like with patient once he expired. Family aware that nursing staff would only need the name of their designated funeral home to call for pick up and transportation of the body. Wife verbalized understanding and stated she contacted them on yesterday and would let the nurses know the name of the facility. While family at bedside bedside RN's and myself noticed patient breathing more shallower. On assessment he very faint heart sounds and agonal breathing. Within 10 min patient was observed to be apneic. On assessment by bedside RN and myself no heart sounds heard. Family made aware that patient had expired. Family very emotional. Support was given during their time of grief. Family appreciative of the continuous support for myself and bedside nurses. Family comfortable and felt they were ready to leave bedside and head home prior to my departure from bedside.    Thank you for allowing the Palliative Medicine Team to assist in the care of this patient.   Time In: 0930  1145 Time Out: 1125  1235 Total Time 115 min.   50 min Prolonged Time Billed  YES        Greater than 50%  of this time was spent counseling and coordinating care related to the above assessment and plan.  Alda Lea, NP-BC Palliative Medicine Team  Phone: [phone] Fax: [phone] Pager: [phone] Amion: Bjorn Pippin   Please contact Palliative Medicine Team phone at [phone] for questions and concerns.

## 2017-11-29 DEATH — deceased

## 2018-02-17 ENCOUNTER — Other Ambulatory Visit: Payer: Self-pay

## 2018-10-21 IMAGING — CT CT HEAD W/O CM
4 series · 14 of 47 positions shown, 16 images · non-contrast
Comparison: 10/31/2017 MRI head. 11/15/2017 CTA head. 10/28/2017 CT
head.

CLINICAL DATA: 76 y/o  M; 76 y/o  M; stroke for follow-up.

EXAM:
CT HEAD WITHOUT CONTRAST
TECHNIQUE: Contiguous axial images were obtained from the base of the skull
through the vertex without intravenous contrast.

[Series 3: head without · axial · non-contrast · 0.46mm/px · z∈[-118,+7]mm · 6 of 36 slices shown, 8 images]
[im 6/36  brain]
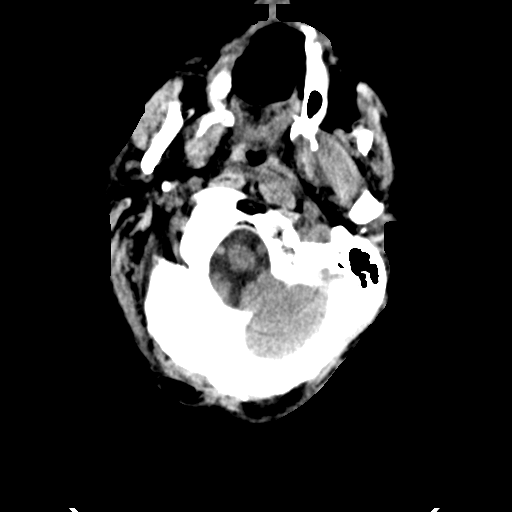
[im 6/36  bone]
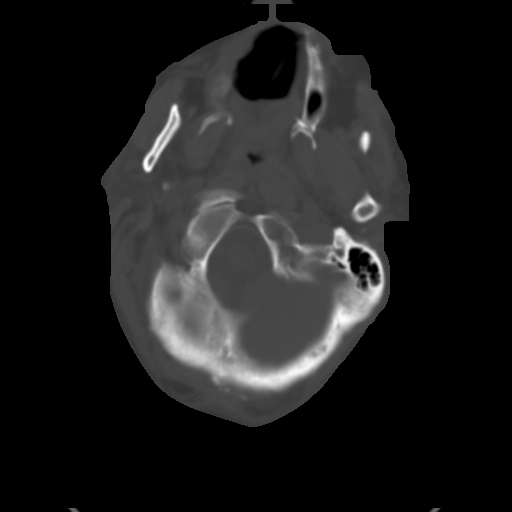
[im 11/36  brain]
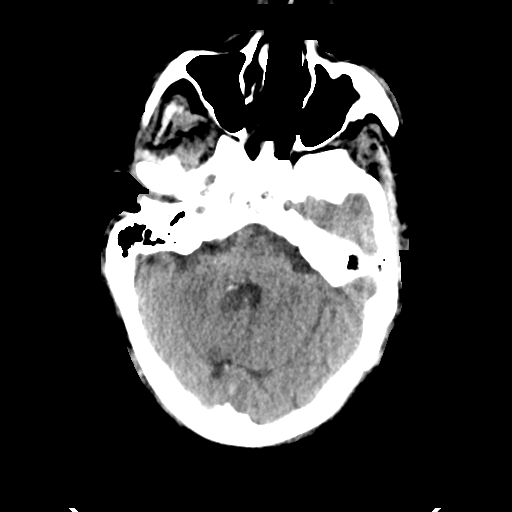
[im 16/36  brain]
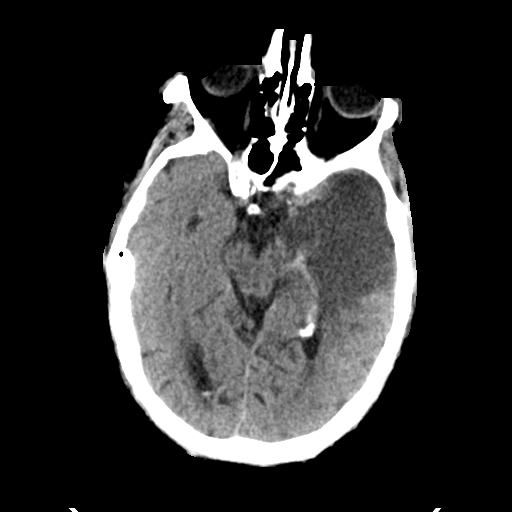
[im 21/36  brain]
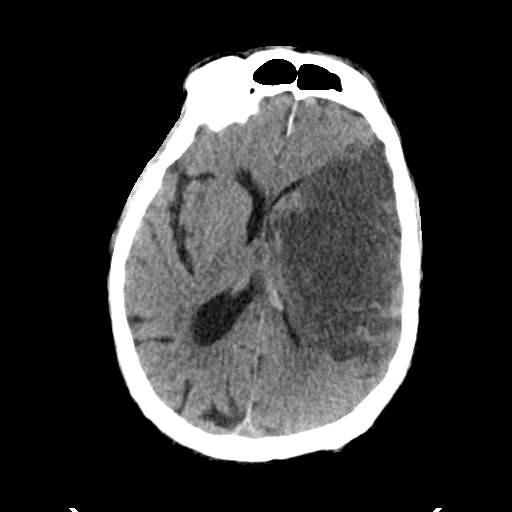
[im 26/36  brain]
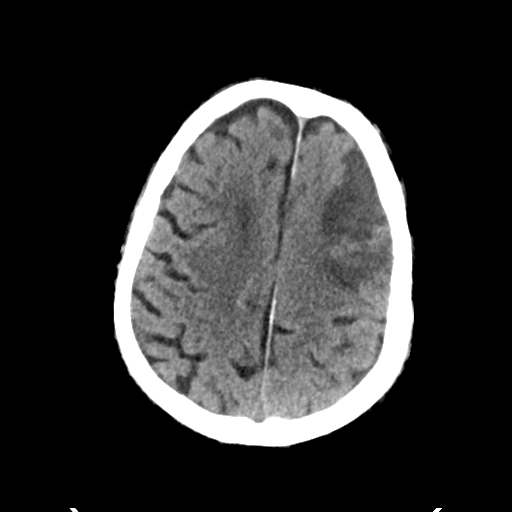
[im 26/36  bone]
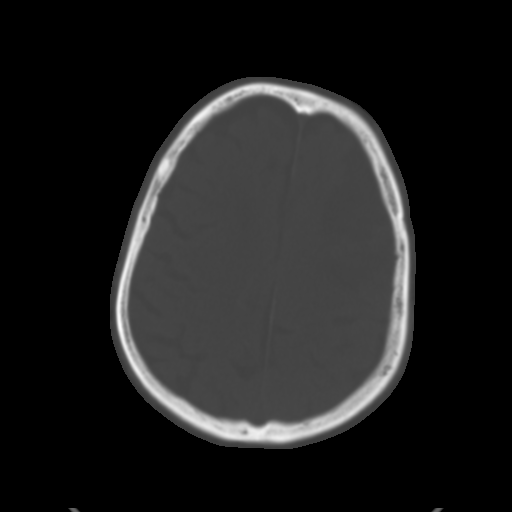
[im 31/36  brain]
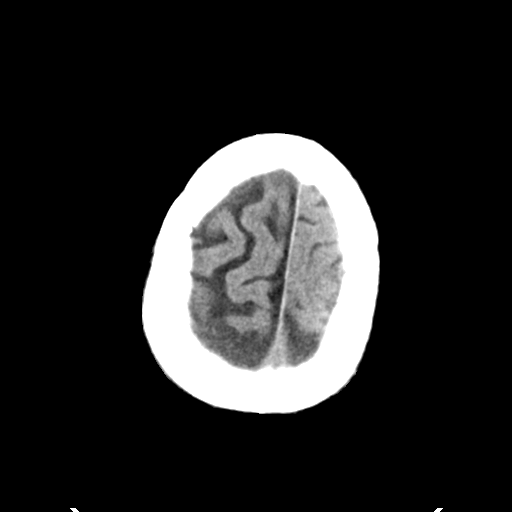

[Series 4: head bone · axial · 0.46mm/px · z∈[-127,-109]mm · 2 of 92 slices shown]
[im 9/92  bone]
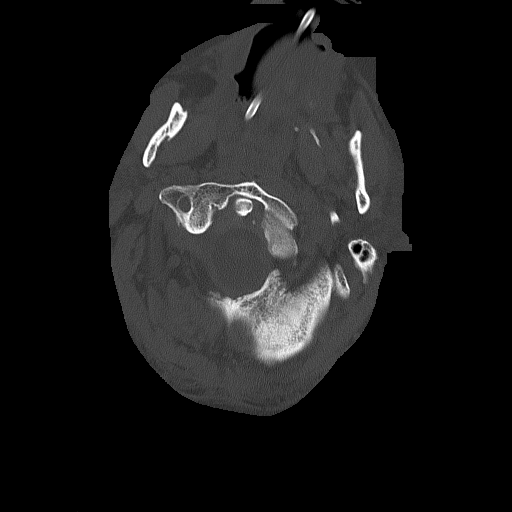
[im 18/92  bone]
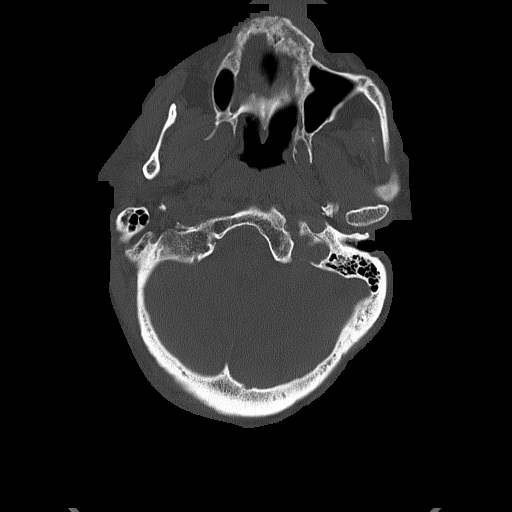

[Series 5: head without cor · coronal · non-contrast · 0.36mm/px · 3 of 82 slices shown]
[im 28/82  brain]
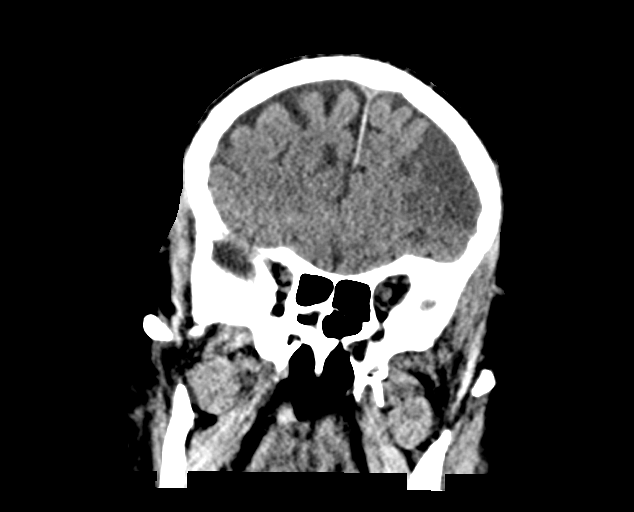
[im 37/82  brain]
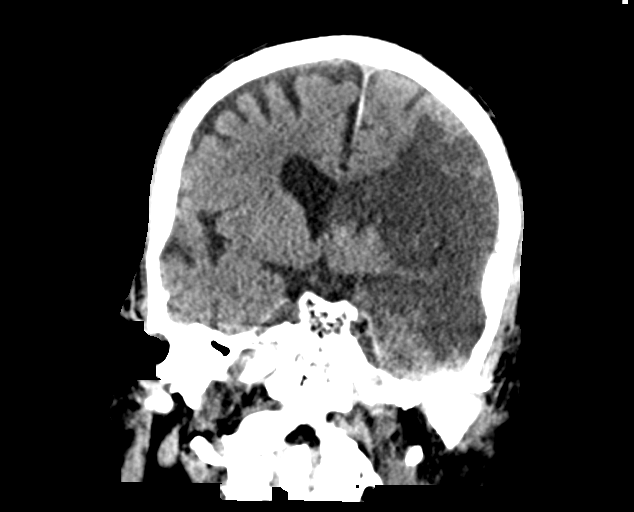
[im 46/82  brain]
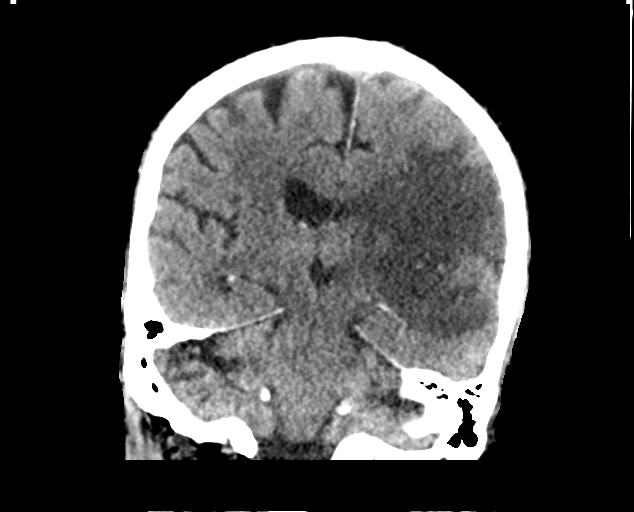

[Series 6: head without sag · sagittal · non-contrast · 0.36mm/px · 3 of 67 slices shown]
[im 23/67  brain]
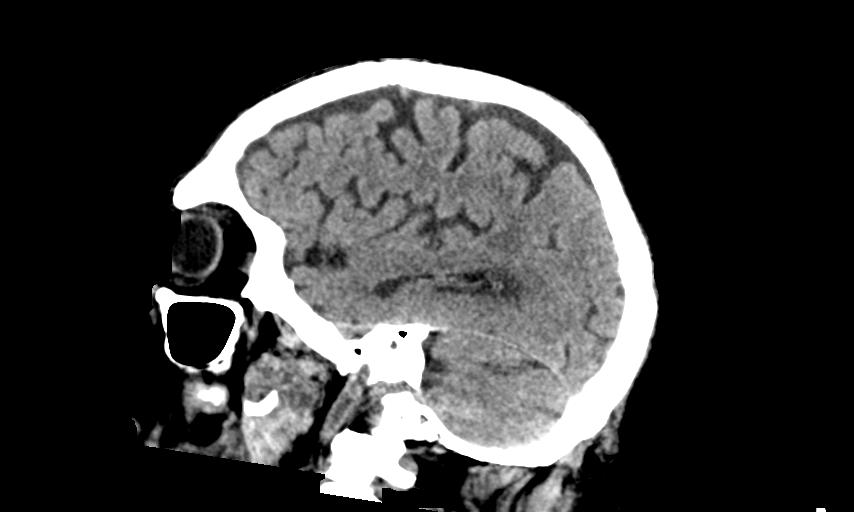
[im 34/67  brain]
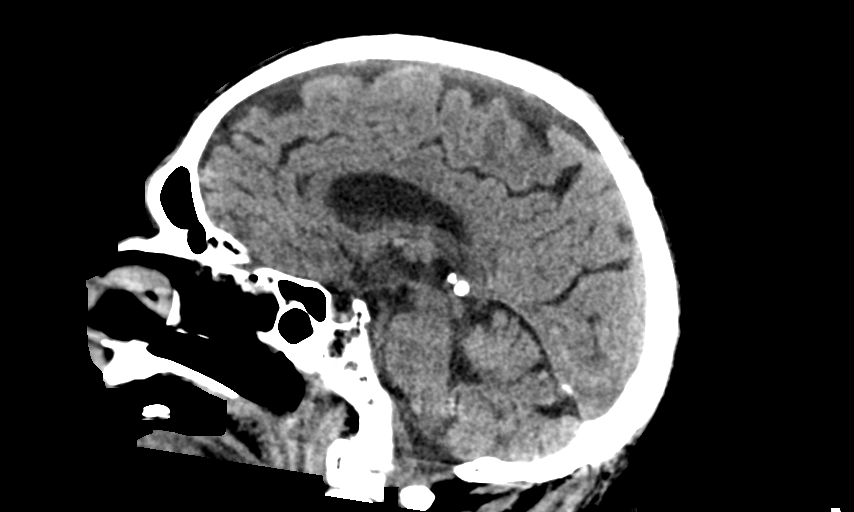
[im 45/67  brain]
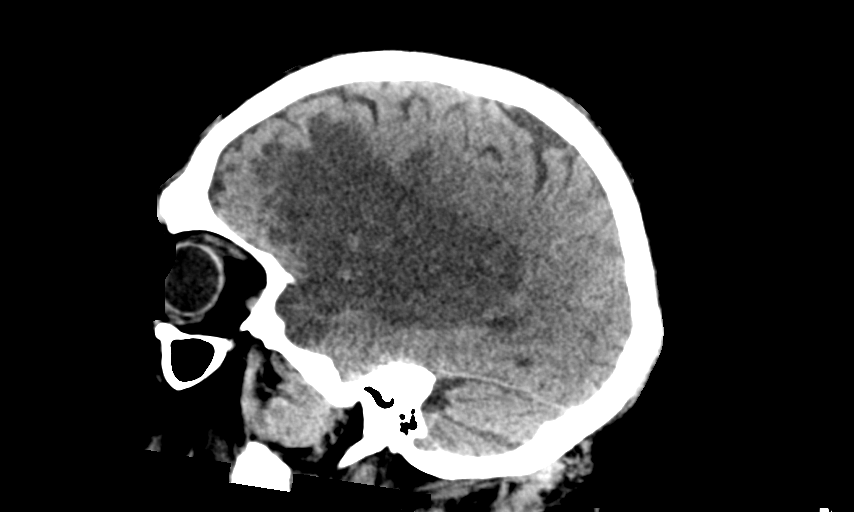

[14 of 47 positions shown; findings below may reference images not displayed]

FINDINGS: Brain: Left MCA large infarction is stable in distribution in
comparison with prior MRI of the head given differences in technique
involving frontal and temporal lobes, insula, and basal ganglia.
Subcentimeter foci of petechial hemorrhage within the hemorrhage are
also stable. There is extensive edema with 6 mm of left-to-right
midline shift, uncal herniation, and effacement of the left lateral
ventricle which is stable to minimally increased from the prior MRI.
No new large acute stroke, hemorrhage, or mass effect is identified.

Vascular: Calcific atherosclerosis of carotid siphons.

Skull: Normal. Negative for fracture or focal lesion.

Sinuses/Orbits: Right ethmoid and sphenoid sinus mucosal thickening.
Chronic inflammatory changes of the walls of right sphenoid sinus.

Other: Fall patient is intubated with nasoenteric tube, partially
visualized.
IMPRESSION: 1. Left MCA infarct is stable in distribution from prior MRI of the
head given differences in technique.
2. Stable petechial hemorrhage within the infarction. No new acute
intracranial hemorrhage.
3. Stable to minimally increased mass effect with 6 mm left-to-right
midline shift, uncal herniation, and partial effacement of left
lateral ventricle.

By: Alexius Sankey M.D.
